# Patient Record
Sex: Female | Born: 1975 | Race: Black or African American | Hispanic: No | Marital: Married | Smoking: Former smoker
Health system: Southern US, Community
[De-identification: ages and names within clinical notes are randomized; demographics above are authoritative.]

## PROBLEM LIST (undated history)

## (undated) DIAGNOSIS — E119 Type 2 diabetes mellitus without complications: Secondary | ICD-10-CM

## (undated) DIAGNOSIS — J45909 Unspecified asthma, uncomplicated: Secondary | ICD-10-CM

## (undated) DIAGNOSIS — I10 Essential (primary) hypertension: Secondary | ICD-10-CM

## (undated) HISTORY — DX: Type 2 diabetes mellitus without complications: E11.9

---

## 2005-07-11 ENCOUNTER — Emergency Department (HOSPITAL_COMMUNITY): Admission: EM | Admit: 2005-07-11 | Discharge: 2005-07-11 | Payer: Self-pay | Admitting: Emergency Medicine

## 2005-09-29 ENCOUNTER — Emergency Department (HOSPITAL_COMMUNITY): Admission: EM | Admit: 2005-09-29 | Discharge: 2005-09-29 | Payer: Self-pay | Admitting: Emergency Medicine

## 2005-12-13 ENCOUNTER — Emergency Department (HOSPITAL_COMMUNITY): Admission: EM | Admit: 2005-12-13 | Discharge: 2005-12-13 | Payer: Self-pay | Admitting: Emergency Medicine

## 2006-09-26 ENCOUNTER — Emergency Department (HOSPITAL_COMMUNITY): Admission: EM | Admit: 2006-09-26 | Discharge: 2006-09-26 | Payer: Self-pay | Admitting: Emergency Medicine

## 2006-12-03 ENCOUNTER — Emergency Department (HOSPITAL_COMMUNITY): Admission: EM | Admit: 2006-12-03 | Discharge: 2006-12-03 | Payer: Self-pay | Admitting: Emergency Medicine

## 2007-12-03 ENCOUNTER — Emergency Department (HOSPITAL_COMMUNITY): Admission: EM | Admit: 2007-12-03 | Discharge: 2007-12-03 | Payer: Self-pay | Admitting: Emergency Medicine

## 2008-04-08 ENCOUNTER — Emergency Department (HOSPITAL_COMMUNITY): Admission: EM | Admit: 2008-04-08 | Discharge: 2008-04-08 | Payer: Self-pay | Admitting: Emergency Medicine

## 2008-12-12 ENCOUNTER — Emergency Department (HOSPITAL_COMMUNITY): Admission: EM | Admit: 2008-12-12 | Discharge: 2008-12-12 | Payer: Self-pay | Admitting: Emergency Medicine

## 2009-06-29 ENCOUNTER — Emergency Department (HOSPITAL_COMMUNITY): Admission: EM | Admit: 2009-06-29 | Discharge: 2009-06-29 | Payer: Self-pay | Admitting: Emergency Medicine

## 2009-09-28 ENCOUNTER — Emergency Department (HOSPITAL_COMMUNITY): Admission: EM | Admit: 2009-09-28 | Discharge: 2009-09-28 | Payer: Self-pay | Admitting: Emergency Medicine

## 2012-01-02 ENCOUNTER — Encounter (HOSPITAL_COMMUNITY): Payer: Self-pay | Admitting: *Deleted

## 2012-01-02 ENCOUNTER — Emergency Department (HOSPITAL_COMMUNITY): Payer: Self-pay

## 2012-01-02 ENCOUNTER — Emergency Department (HOSPITAL_COMMUNITY)
Admission: EM | Admit: 2012-01-02 | Discharge: 2012-01-02 | Disposition: A | Discharge status: Home / Self Care | Payer: Self-pay | Attending: Emergency Medicine | Admitting: Emergency Medicine

## 2012-01-02 DIAGNOSIS — S92911A Unspecified fracture of right toe(s), initial encounter for closed fracture: Secondary | ICD-10-CM

## 2012-01-02 DIAGNOSIS — Y92009 Unspecified place in unspecified non-institutional (private) residence as the place of occurrence of the external cause: Secondary | ICD-10-CM | POA: Insufficient documentation

## 2012-01-02 DIAGNOSIS — S92919A Unspecified fracture of unspecified toe(s), initial encounter for closed fracture: Secondary | ICD-10-CM | POA: Insufficient documentation

## 2012-01-02 DIAGNOSIS — IMO0002 Reserved for concepts with insufficient information to code with codable children: Secondary | ICD-10-CM | POA: Insufficient documentation

## 2012-01-02 DIAGNOSIS — Z91013 Allergy to seafood: Secondary | ICD-10-CM | POA: Insufficient documentation

## 2012-01-02 DIAGNOSIS — F17200 Nicotine dependence, unspecified, uncomplicated: Secondary | ICD-10-CM | POA: Insufficient documentation

## 2012-01-02 MED ORDER — IBUPROFEN 600 MG PO TABS: 600.0000 mg | ORAL_TABLET | Freq: Four times a day (QID) | ORAL | Status: AC | PRN

## 2012-01-02 MED ORDER — HYDROCODONE-ACETAMINOPHEN 5-325 MG PO TABS: 1.0000 | ORAL_TABLET | ORAL | Status: AC | PRN

## 2012-01-02 MED ORDER — IBUPROFEN 800 MG PO TABS
800.0000 mg | ORAL_TABLET | Freq: Once | ORAL | Status: AC
  Administered 2012-01-02: 800 mg via ORAL
  Filled 2012-01-02: qty 1

## 2012-01-02 NOTE — Discharge Instructions (Signed)
Toe Fracture  Your caregiver has diagnosed you as having a fractured toe. A toe fracture is a break in the bone of a toe. "Buddy taping" is a way of splinting your broken toe, by taping the broken toe to the toe next to it. This "buddy taping" will keep the injured toe from moving beyond normal range of motion. Buddy taping also helps the toe heal in a more normal alignment. It may take 6 to 8 weeks for the toe injury to heal.  HOME CARE INSTRUCTIONS   · Leave your toes taped together for as long as directed by your caregiver or until you see a doctor for a follow-up examination. You can change the tape after bathing. Always use a small piece of gauze or cotton between the toes when taping them together. This will help the skin stay dry and prevent infection.  · Apply ice to the injury for 15 to 20 minutes each hour while awake for the first 2 days. Put the ice in a plastic bag and place a towel between the bag of ice and your skin.  · After the first 2 days, apply heat to the injured area. Use heat for the next 2 to 3 days. Place a heating pad on the foot or soak the foot in warm water as directed by your caregiver.  · Keep your foot elevated as much as possible to lessen swelling.  · Wear sturdy, supportive shoes. The shoes should not pinch the toes or fit tightly against the toes.  · Your caregiver may prescribe a rigid shoe if your foot is very swollen.  · Your may be given crutches if the pain is too great and it hurts too much to walk.  · Only take over-the-counter or prescription medicines for pain, discomfort, or fever as directed by your caregiver.  · If your caregiver has given you a follow-up appointment, it is very important to keep that appointment. Not keeping the appointment could result in a chronic or permanent injury, pain, and disability. If there is any problem keeping the appointment, you must call back to this facility for assistance.  SEEK MEDICAL CARE IF:   · You have increased pain or  swelling, not relieved with medications.  · The pain does not get better after 1 week.  · Your injured toe is cold when the others are warm.  SEEK IMMEDIATE MEDICAL CARE IF:   · The toe becomes cold, numb, or white.  · The toe becomes hot (inflamed) and red.  Document Released: 06/29/2000 Document Revised: 06/21/2011 Document Reviewed: 02/16/2008  [employer]® Patient Information ©2012 [employer].

## 2012-01-02 NOTE — ED Notes (Signed)
Struck rt foot against sofa , Pain 4-5th toes.

## 2012-01-05 NOTE — ED Provider Notes (Signed)
History     CSN: [id number]  Arrival date & time 01/02/12  1130   First MD Initiated Contact with Patient 01/02/12 1209      Chief Complaint  Patient presents with  . Toe Injury    (Consider location/radiation/quality/duration/timing/severity/associated sxs/prior treatment) HPI Comments: RUBI TOOLEY presents with continued pain in her right 4th and fifth toes after accidentally kicking the base of a sofa at home today.  Pain is constant and radiates into midfoot with attempts to flex her toes.  She has increased pain with weight bearing.  Rest improves the pain,  And she has tried no medications for relief of her symptoms.  The history is provided by the patient.    History reviewed. No pertinent past medical history.  History reviewed. No pertinent past surgical history.  History reviewed. No pertinent family history.  History  Substance Use Topics  . Smoking status: Current Everyday Smoker  . Smokeless tobacco: Not on file  . Alcohol Use: No    OB History    Grav Para Term Preterm Abortions TAB SAB Ect Mult Living                  Review of Systems  Musculoskeletal: Positive for arthralgias. Negative for joint swelling.  Skin: Negative for wound.  Neurological: Negative for weakness and numbness.    Allergies  Catfish  Home Medications   Current Outpatient Rx  Name Route Sig Dispense Refill  . VITAMIN E 1000 UNITS PO CAPS Oral Take 1,000 Units by mouth daily.    Marland Kitchen HYDROCODONE-ACETAMINOPHEN 5-325 MG PO TABS Oral Take 1 tablet by mouth every 4 (four) hours as needed for pain. 15 tablet 0  . IBUPROFEN 600 MG PO TABS Oral Take 1 tablet (600 mg total) by mouth every 6 (six) hours as needed for pain. 20 tablet 0    BP 127/82  Pulse 92  Temp 98 F (36.7 C) (Oral)  Resp 20  Ht 5\' 6"  (1.676 m)  Wt 250 lb (113.399 kg)  BMI 40.35 kg/m2  SpO2 97%  LMP 12/24/2011  Physical Exam  Constitutional: She appears well-developed and well-nourished.  HENT:    Head: Atraumatic.  Neck: Normal range of motion.  Cardiovascular:       Pulses equal bilaterally  Musculoskeletal: She exhibits tenderness.       Right foot: She exhibits bony tenderness and swelling. She exhibits normal capillary refill and no laceration.       Feet:  Neurological: She is alert. She has normal strength. She displays normal reflexes. No sensory deficit.       Equal strength  Skin: Skin is warm and dry.  Psychiatric: She has a normal mood and affect.    ED Course  Procedures (including critical care time)  Labs Reviewed - No data to display No results found.   1. Toe fracture, right     Post op shoe,  Buddy tape provided by RN  MDM  Patients xrays reviewed with pt.  Prescribed ibuprofen,  Hydrocodone.  Encouraged ice and elevation.  Recheck with Dr. Romeo Apple within 1 week for recheck of injury.        Burgess Amor, [place] 01/05/12 1317

## 2012-01-06 NOTE — ED Provider Notes (Signed)
Medical screening examination/treatment/procedure(s) were performed by non-physician practitioner and as supervising physician I was immediately available for consultation/collaboration.   Taesha Goodell W Trenity Pha, MD 01/06/12 2051 

## 2012-01-08 ENCOUNTER — Telehealth: Payer: Self-pay | Admitting: Orthopedic Surgery

## 2012-01-08 NOTE — Telephone Encounter (Signed)
Rachel Hopkins called to request a follow-up appointment with Dr. Romeo Apple for a fractured toe.  She does not have insurance, so I told her our selfpay policy.  Spoke with our manager, Doneen Poisson and was told that we could schedule her if she would bring at least $50.  I called [name] back to relay this message and she said she had already spoke with the ER department and was told she could come back there for a recheck  And could get a return to work note there, so she did not need the appointment here.  She was ok with this, and thanked me for calling her back.

## 2012-01-09 NOTE — ED Notes (Signed)
Pt arrived to registration desk, she told the clerk that she needed a note to return to work or she would lose her job, rn out to speak w. Pt and explained that she needed to follow up with dr.harrison as ordered from previous visit.  Pt became irate, cursing at staff, "you bitch don't care and is ignorant"   Again attempted to explain to pt, she began yelling and wanted to speak to my supervisor, dr.wickline notified of pt's request and behavior.  Note given to pt for work, cont. To curse at staff and overheard cursing while leaving.

## 2012-07-20 ENCOUNTER — Encounter (HOSPITAL_COMMUNITY): Payer: Self-pay | Admitting: *Deleted

## 2012-07-20 ENCOUNTER — Emergency Department (HOSPITAL_COMMUNITY)
Admission: EM | Admit: 2012-07-20 | Discharge: 2012-07-20 | Disposition: A | Discharge status: Home / Self Care | Payer: Self-pay | Attending: Emergency Medicine | Admitting: Emergency Medicine

## 2012-07-20 DIAGNOSIS — F17200 Nicotine dependence, unspecified, uncomplicated: Secondary | ICD-10-CM | POA: Insufficient documentation

## 2012-07-20 DIAGNOSIS — L738 Other specified follicular disorders: Secondary | ICD-10-CM | POA: Insufficient documentation

## 2012-07-20 DIAGNOSIS — L739 Follicular disorder, unspecified: Secondary | ICD-10-CM

## 2012-07-20 DIAGNOSIS — R51 Headache: Secondary | ICD-10-CM

## 2012-07-20 DIAGNOSIS — J45909 Unspecified asthma, uncomplicated: Secondary | ICD-10-CM | POA: Insufficient documentation

## 2012-07-20 DIAGNOSIS — R42 Dizziness and giddiness: Secondary | ICD-10-CM | POA: Insufficient documentation

## 2012-07-20 HISTORY — DX: Unspecified asthma, uncomplicated: J45.909

## 2012-07-20 MED ORDER — HYDROCODONE-ACETAMINOPHEN 5-325 MG PO TABS
1.0000 | ORAL_TABLET | Freq: Once | ORAL | Status: AC
  Administered 2012-07-20: 1 via ORAL
  Filled 2012-07-20: qty 1

## 2012-07-20 MED ORDER — IBUPROFEN 800 MG PO TABS
800.0000 mg | ORAL_TABLET | Freq: Once | ORAL | Status: AC
  Administered 2012-07-20: 800 mg via ORAL
  Filled 2012-07-20: qty 1

## 2012-07-20 MED ORDER — ONDANSETRON 4 MG PO TBDP
4.0000 mg | ORAL_TABLET | Freq: Once | ORAL | Status: AC
  Administered 2012-07-20: 4 mg via ORAL
  Filled 2012-07-20: qty 1

## 2012-07-20 NOTE — ED Provider Notes (Signed)
History     CSN: [id number]  Arrival date & time 07/20/12  1478   First MD Initiated Contact with Patient 07/20/12 [phone]      Chief Complaint  Patient presents with  . Headache  . Dizziness    (Consider location/radiation/quality/duration/timing/severity/associated sxs/prior treatment) HPI  Rachel Hopkins is a 37 y.o. female who presents to the Emergency Department complaining of headache that woke her from sleep. Headache on the left side of her head and then spread across her forehead. She noticed there is a "bump" on the left side of her head and it is painful. Denies vision changes, hearing changes, trouble speaking or swallowing, fever, chills, nausea, vomiting, shortness of breath, cough, dizziness, weakness.     Past Medical History  Diagnosis Date  . Asthma     History reviewed. No pertinent past surgical history.  History reviewed. No pertinent family history.  History  Substance Use Topics  . Smoking status: Current Every Day Smoker  . Smokeless tobacco: Not on file  . Alcohol Use: No    OB History    Grav Para Term Preterm Abortions TAB SAB Ect Mult Living                  Review of Systems  Constitutional: Negative for fever.       10 Systems reviewed and are negative for acute change except as noted in the HPI.  HENT: Negative for congestion.   Eyes: Negative for discharge and redness.  Respiratory: Negative for cough and shortness of breath.   Cardiovascular: Negative for chest pain.  Gastrointestinal: Negative for vomiting and abdominal pain.  Musculoskeletal: Negative for back pain.  Skin: Negative for rash.  Neurological: Positive for headaches. Negative for syncope and numbness.  Psychiatric/Behavioral:       No behavior change.    Allergies  Catfish  Home Medications  No current outpatient prescriptions on file.  BP 137/88  Pulse 94  Temp 98.3 F (36.8 C) (Oral)  Resp 18  Ht 5\' 7"  (1.702 m)  Wt 250 lb (113.399 kg)  BMI 39.16  kg/m2  SpO2 98%  LMP 06/29/2012  Physical Exam  Nursing note and vitals reviewed. Constitutional: She is oriented to person, place, and time. She appears well-nourished.       Awake, alert, nontoxic appearance.  HENT:  Head: Normocephalic and atraumatic.  Right Ear: External ear normal.  Left Ear: External ear normal.  Eyes: Conjunctivae normal and EOM are normal. Pupils are equal, round, and reactive to light. Right eye exhibits no discharge. Left eye exhibits no discharge.  Neck: Neck supple.  Cardiovascular: Normal heart sounds.   Pulmonary/Chest: Effort normal and breath sounds normal. She exhibits no tenderness.  Abdominal: Soft. There is no tenderness. There is no rebound.  Musculoskeletal: Normal range of motion. She exhibits no tenderness.       Baseline ROM, no obvious new focal weakness.  Neurological: She is alert and oriented to person, place, and time. She has normal reflexes. No cranial nerve deficit. Coordination normal.       Mental status and motor strength appears baseline for patient and situation.  Skin: No rash noted.       10mm raised tender papule to left temporal area above ear c/w infected hair follicle.   Psychiatric: She has a normal mood and affect.    ED Course  Procedures (including critical care time)    MDM  Patient with a headache that woke them from sleep. No  focal deficits. Given antiinflammatory and analgesic. Pt stable in ED with no significant deterioration in condition.The patient appears reasonably screened and/or stabilized for discharge and I doubt any other medical condition or other [facility] requiring further screening, evaluation, or treatment in the ED at this time prior to discharge.  MDM Reviewed: nursing note and vitals           Nicoletta Dress. Colon Branch, MD 07/20/12 [phone]

## 2012-07-20 NOTE — ED Notes (Signed)
Pt woke up with a headache. Pt states she was dizzy, as well when she woke up. Pt has small bump just over her left ear.

## 2012-07-20 NOTE — ED Notes (Signed)
Discharge instructions given and reviewed with patient.  Patient verbalized understanding to take Tylenol and Motrin for headache.  Patient ambulatory with steady gait. Discharged home in good condition.

## 2012-09-26 ENCOUNTER — Encounter (HOSPITAL_COMMUNITY): Payer: Self-pay | Admitting: *Deleted

## 2012-09-26 ENCOUNTER — Emergency Department (HOSPITAL_COMMUNITY): Payer: Self-pay

## 2012-09-26 ENCOUNTER — Emergency Department (HOSPITAL_COMMUNITY)
Admission: EM | Admit: 2012-09-26 | Discharge: 2012-09-26 | Disposition: A | Discharge status: Home / Self Care | Payer: Self-pay | Attending: Emergency Medicine | Admitting: Emergency Medicine

## 2012-09-26 DIAGNOSIS — R112 Nausea with vomiting, unspecified: Secondary | ICD-10-CM | POA: Insufficient documentation

## 2012-09-26 DIAGNOSIS — J40 Bronchitis, not specified as acute or chronic: Secondary | ICD-10-CM

## 2012-09-26 DIAGNOSIS — J45909 Unspecified asthma, uncomplicated: Secondary | ICD-10-CM | POA: Insufficient documentation

## 2012-09-26 DIAGNOSIS — F17200 Nicotine dependence, unspecified, uncomplicated: Secondary | ICD-10-CM | POA: Insufficient documentation

## 2012-09-26 DIAGNOSIS — R079 Chest pain, unspecified: Secondary | ICD-10-CM | POA: Insufficient documentation

## 2012-09-26 DIAGNOSIS — J029 Acute pharyngitis, unspecified: Secondary | ICD-10-CM | POA: Insufficient documentation

## 2012-09-26 DIAGNOSIS — J3489 Other specified disorders of nose and nasal sinuses: Secondary | ICD-10-CM | POA: Insufficient documentation

## 2012-09-26 MED ORDER — ALBUTEROL SULFATE HFA 108 (90 BASE) MCG/ACT IN AERS
2.0000 | INHALATION_SPRAY | Freq: Four times a day (QID) | RESPIRATORY_TRACT | Status: DC
  Administered 2012-09-26: 2 via RESPIRATORY_TRACT
  Filled 2012-09-26: qty 6.7

## 2012-09-26 MED ORDER — MUCINEX DM 30-600 MG PO TB12: 1.0000 | ORAL_TABLET | Freq: Two times a day (BID) | ORAL | Status: DC

## 2012-09-26 NOTE — ED Notes (Signed)
Pt states productive cough, white in color. Vomited last night. States when cough gets bad, it causes her to vomit. NAD at this time.

## 2012-09-26 NOTE — ED Provider Notes (Signed)
History     CSN: [id number]  Arrival date & time 09/26/12  1001   First MD Initiated Contact with Patient 09/26/12 1313      Chief Complaint  Patient presents with  . Cough  . Emesis    (Consider location/radiation/quality/duration/timing/severity/associated sxs/prior treatment) Patient is a 37 y.o. female presenting with cough and vomiting. The history is provided by the patient.  Cough Associated symptoms: chest pain and sore throat   Associated symptoms: no fever, no rash, no shortness of breath and no wheezing   Emesis Associated symptoms: sore throat   Associated symptoms: no abdominal pain and no diarrhea    patient with symptoms consistent with upper respiratory infection for the past 3 days. Significant cough and productive cough for the past 2 days. Post tussive emesis being reported. The patient and trying over-the-counter cold medicines. Patient has chest soreness but no rub chest pain soreness is throughout the entire chest with coughing. Patient's cough is productive white in color. Positive nasal congestion.  Past Medical History  Diagnosis Date  . Asthma     History reviewed. No pertinent past surgical history.  No family history on file.  History  Substance Use Topics  . Smoking status: Current Every Day Smoker    Types: Cigarettes  . Smokeless tobacco: Not on file  . Alcohol Use: No    OB History   Grav Para Term Preterm Abortions TAB SAB Ect Mult Living                  Review of Systems  Constitutional: Negative for fever.  HENT: Positive for congestion, sore throat and sinus pressure.   Respiratory: Positive for cough. Negative for shortness of breath and wheezing.   Cardiovascular: Positive for chest pain.  Gastrointestinal: Positive for nausea and vomiting. Negative for abdominal pain and diarrhea.  Genitourinary: Negative for dysuria.  Musculoskeletal: Negative for back pain.  Skin: Negative for rash.  Hematological: Does not  bruise/bleed easily.    Allergies  Catfish  Home Medications   Current Outpatient Rx  Name  Route  Sig  Dispense  Refill  . dextromethorphan-guaiFENesin (MUCINEX DM) 30-600 MG per 12 hr tablet   Oral   Take 1 tablet by mouth every 12 (twelve) hours.         . Pseudoeph-Doxylamine-DM-APAP (NYQUIL D COLD/FLU PO)   Oral   Take 30 mLs by mouth every 4 (four) hours as needed (cold symptoms).         . Dextromethorphan-Guaifenesin (MUCINEX DM) 30-600 MG TB12   Oral   Take 1 tablet by mouth every 12 (twelve) hours.   28 each   0     BP 127/85  Pulse 78  Temp(Src) 98.2 F (36.8 C) (Oral)  Resp 18  Ht 5\' 6"  (1.676 m)  Wt 400 lb (181.439 kg)  BMI 64.59 kg/m2  SpO2 97%  LMP 09/25/2012  Physical Exam  Nursing note and vitals reviewed. Constitutional: She is oriented to person, place, and time. She appears well-developed and well-nourished. No distress.  HENT:  Head: Normocephalic and atraumatic.  Mouth/Throat: Oropharynx is clear and moist.  Eyes: Conjunctivae and EOM are normal. Pupils are equal, round, and reactive to light.  Neck: Normal range of motion. Neck supple.  Cardiovascular: Normal rate, regular rhythm and normal heart sounds.   No murmur heard. Pulmonary/Chest: Effort normal and breath sounds normal. No respiratory distress. She has no wheezes.  Abdominal: Soft. Bowel sounds are normal. There is no tenderness.  Musculoskeletal: Normal range of motion. She exhibits no edema.  Neurological: She is alert and oriented to person, place, and time. No cranial nerve deficit. Coordination normal.  Skin: Skin is warm and dry. No rash noted.    ED Course  Procedures (including critical care time)  Labs Reviewed - No data to display Dg Chest 2 View  09/26/2012  *RADIOLOGY REPORT*  Clinical Data: Cough and congestion.  Vomiting.  CHEST - 2 VIEW  Comparison: None.  Findings: Lungs are clear.  Heart size is normal.  No pneumothorax or pleural fluid.  IMPRESSION:  Negative chest.   Original Report Authenticated By: Holley Dexter, M.D.      1. Bronchitis       MDM   The patient's chest x-rays negative symptoms consistent with a 3 day history of upper respiratory infection now predominantly of bronchitis. No evidence of pneumonia. Patient nontoxic no acute distress. Will treat with Mucinex DM and albuterol inhaler. Patient will return for any newer worse symptoms.        Shelda Jakes, MD 09/26/12 [phone]

## 2012-09-26 NOTE — ED Notes (Addendum)
States that she started having nasal congestion about 3 days ago, sore throat, cough and now chest soreness and back pain from the coughing.  Unable to visualize posterior pharynx, due to patient fear of gagging.

## 2013-02-26 ENCOUNTER — Ambulatory Visit (HOSPITAL_COMMUNITY)
Admission: RE | Admit: 2013-02-26 | Discharge: 2013-02-26 | Disposition: A | Discharge status: Home / Self Care | Payer: Self-pay | Source: Ambulatory Visit | Attending: Family Medicine | Admitting: Family Medicine

## 2013-02-26 ENCOUNTER — Other Ambulatory Visit (HOSPITAL_COMMUNITY): Payer: Self-pay | Admitting: *Deleted

## 2013-02-26 DIAGNOSIS — M549 Dorsalgia, unspecified: Secondary | ICD-10-CM

## 2013-02-26 DIAGNOSIS — M47817 Spondylosis without myelopathy or radiculopathy, lumbosacral region: Secondary | ICD-10-CM | POA: Insufficient documentation

## 2014-10-16 ENCOUNTER — Encounter (HOSPITAL_COMMUNITY): Payer: Self-pay | Admitting: Emergency Medicine

## 2014-10-16 ENCOUNTER — Emergency Department (HOSPITAL_COMMUNITY): Payer: Self-pay

## 2014-10-16 ENCOUNTER — Emergency Department (HOSPITAL_COMMUNITY)
Admission: EM | Admit: 2014-10-16 | Discharge: 2014-10-17 | Disposition: A | Discharge status: Home / Self Care | Payer: Self-pay | Attending: Emergency Medicine | Admitting: Emergency Medicine

## 2014-10-16 DIAGNOSIS — Z720 Tobacco use: Secondary | ICD-10-CM | POA: Insufficient documentation

## 2014-10-16 DIAGNOSIS — R0789 Other chest pain: Secondary | ICD-10-CM | POA: Insufficient documentation

## 2014-10-16 DIAGNOSIS — Z79899 Other long term (current) drug therapy: Secondary | ICD-10-CM | POA: Insufficient documentation

## 2014-10-16 DIAGNOSIS — B349 Viral infection, unspecified: Secondary | ICD-10-CM | POA: Insufficient documentation

## 2014-10-16 DIAGNOSIS — Z3202 Encounter for pregnancy test, result negative: Secondary | ICD-10-CM | POA: Insufficient documentation

## 2014-10-16 DIAGNOSIS — J45909 Unspecified asthma, uncomplicated: Secondary | ICD-10-CM | POA: Insufficient documentation

## 2014-10-16 LAB — URINALYSIS, ROUTINE W REFLEX MICROSCOPIC
Bilirubin Urine: NEGATIVE
Glucose, UA: NEGATIVE mg/dL
Hgb urine dipstick: NEGATIVE
LEUKOCYTES UA: NEGATIVE
Nitrite: NEGATIVE
PH: 6.5 (ref 5.0–8.0)
PROTEIN: NEGATIVE mg/dL
SPECIFIC GRAVITY, URINE: 1.02 (ref 1.005–1.030)
Urobilinogen, UA: 1 mg/dL (ref 0.0–1.0)

## 2014-10-16 LAB — CBC WITH DIFFERENTIAL/PLATELET
BASOS ABS: 0 10*3/uL (ref 0.0–0.1)
Basophils Relative: 0 % (ref 0–1)
EOS PCT: 5 % (ref 0–5)
Eosinophils Absolute: 0.6 10*3/uL (ref 0.0–0.7)
HEMATOCRIT: 43.4 % (ref 36.0–46.0)
Hemoglobin: 14.4 g/dL (ref 12.0–15.0)
LYMPHS ABS: 2.9 10*3/uL (ref 0.7–4.0)
Lymphocytes Relative: 25 % (ref 12–46)
MCH: 30 pg (ref 26.0–34.0)
MCHC: 33.2 g/dL (ref 30.0–36.0)
MCV: 90.4 fL (ref 78.0–100.0)
MONO ABS: 1 10*3/uL (ref 0.1–1.0)
Monocytes Relative: 9 % (ref 3–12)
NEUTROS PCT: 61 % (ref 43–77)
Neutro Abs: 7 10*3/uL (ref 1.7–7.7)
PLATELETS: 204 10*3/uL (ref 150–400)
RBC: 4.8 MIL/uL (ref 3.87–5.11)
RDW: 13.4 % (ref 11.5–15.5)
WBC: 11.6 10*3/uL — ABNORMAL HIGH (ref 4.0–10.5)

## 2014-10-16 LAB — PREGNANCY, URINE: PREG TEST UR: NEGATIVE

## 2014-10-16 MED ORDER — ONDANSETRON 4 MG PO TBDP
4.0000 mg | ORAL_TABLET | Freq: Once | ORAL | Status: AC
  Administered 2014-10-16: 4 mg via ORAL
  Filled 2014-10-16: qty 1

## 2014-10-16 MED ORDER — ONDANSETRON HCL 4 MG PO TABS
8.0000 mg | ORAL_TABLET | Freq: Once | ORAL | Status: DC
Start: 2014-10-16 — End: 2014-10-16

## 2014-10-16 MED ORDER — ONDANSETRON 4 MG PO TBDP
4.0000 mg | ORAL_TABLET | Freq: Once | ORAL | Status: DC
  Filled 2014-10-16: qty 1

## 2014-10-16 NOTE — ED Provider Notes (Signed)
CSN: [id number]     Arrival date & time 10/16/14  2032 History   None    Chief Complaint  Patient presents with  . Dizziness  . Emesis     (Consider location/radiation/quality/duration/timing/severity/associated sxs/prior Treatment) HPI Comments: Patient is a 39 year old female presents to the emergency department with dizziness and vomiting. The patient states that for the last 2 or 3 days she has been having episodes of epigastric area pain, vomiting, and dizziness. The patient also complains of some pain of the anterior chest. She states that she has a sensation of being lightheaded. At times she feels as though she is short of breath. She has not measured any temperature elevation. She has not been noticing any blood in the sputum or phlegm. There is no been no injury or trauma to the chest or abdomen area.  Patient is a 39 y.o. female presenting with dizziness and vomiting. The history is provided by the patient.  Dizziness Duration:  2 days Timing:  Intermittent Associated symptoms: vomiting   Emesis Associated symptoms: abdominal pain     Past Medical History  Diagnosis Date  . Asthma    History reviewed. No pertinent past surgical history. History reviewed. No pertinent family history. History  Substance Use Topics  . Smoking status: Current Every Day Smoker    Types: Cigarettes  . Smokeless tobacco: Not on file  . Alcohol Use: No   OB History    No data available     Review of Systems  Gastrointestinal: Positive for vomiting and abdominal pain.  Neurological: Positive for dizziness.  All other systems reviewed and are negative.     Allergies  Catfish  Home Medications   Prior to Admission medications   Medication Sig Start Date End Date Taking? Authorizing Provider  ranitidine (ZANTAC) 150 MG tablet Take 150 mg by mouth daily as needed for heartburn.   Yes Historical Provider, MD  Dextromethorphan-Guaifenesin (MUCINEX DM) 30-600 MG TB12 Take 1 tablet by  mouth every 12 (twelve) hours. Patient not taking: Reported on 10/16/2014 09/26/12   Vanetta MuldersScott Zackowski, MD   BP 118/82 mmHg  Pulse 90  Temp(Src) 98.2 F (36.8 C) (Oral)  Resp 18  Ht 5\' 6"  (1.676 m)  Wt 378 lb (171.46 kg)  BMI 61.04 kg/m2  SpO2 97%  LMP 06/07/2014 Physical Exam  Constitutional: She is oriented to person, place, and time. She appears well-developed and well-nourished.  Non-toxic appearance.  HENT:  Head: Normocephalic.  Right Ear: Tympanic membrane and external ear normal.  Left Ear: Tympanic membrane and external ear normal.  Eyes: EOM and lids are normal. Pupils are equal, round, and reactive to light.  Neck: Normal range of motion. Neck supple. Carotid bruit is not present.  Cardiovascular: Normal rate, regular rhythm, normal heart sounds, intact distal pulses and normal pulses.   Pulmonary/Chest: Breath sounds normal. No respiratory distress. She has no wheezes. She has no rales. She exhibits tenderness.    Abdominal: Soft. Bowel sounds are normal. There is tenderness in the epigastric area. There is no guarding.  Musculoskeletal: Normal range of motion.  No lower extremity edema. Neg Homan's sign.  Lymphadenopathy:       Head (right side): No submandibular adenopathy present.       Head (left side): No submandibular adenopathy present.    She has no cervical adenopathy.  Neurological: She is alert and oriented to person, place, and time. She has normal strength. No cranial nerve deficit or sensory deficit. She exhibits normal muscle  tone. Coordination normal.  Skin: Skin is warm and dry.  Psychiatric: She has a normal mood and affect. Her speech is normal.  Nursing note and vitals reviewed.   ED Course  Procedures (including critical care time) Labs Review Labs Reviewed  URINALYSIS, ROUTINE W REFLEX MICROSCOPIC - Abnormal; Notable for the following:    Ketones, ur TRACE (*)    All other components within normal limits  PREGNANCY, URINE    Imaging  Review No results found.   EKG Interpretation None      MDM  Vital signs are well within normal limits. Pulse oximetry is 97% on room air. Within normal limits by my interpretation.  Complete blood count shows a slight elevation of the white blood cells of 11,600. The complete blood count is otherwise within normal limits. A comprehensive metabolic panel shows the ALT to be elevated at 40, otherwise normal. The d-dimer is within normal limits at 0.35. Urine analysis shows a trace of ketones otherwise within normal limits.   Chest x-ray shows no active or acute cardiopulmonary process present. 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  The plan at this time is for the patient to increase fluids. Use Tylenol or ibuprofen for soreness. Prescription for Zofran, Voltaren, and Antivert given to the patient.    Final diagnoses:  None    *I have reviewed nursing notes, vital signs, and all appropriate lab and imaging results for this patient.[address], PA-C 10/17/14 4098  Zadie Rhine, MD 10/17/14 [phone]

## 2014-10-16 NOTE — ED Notes (Signed)
Patient reports dizziness, emesis, epigastric pain since Thursday. Patient also reports feels lightheaded and short of breath.

## 2014-10-17 LAB — COMPREHENSIVE METABOLIC PANEL
ALK PHOS: 66 U/L (ref 39–117)
ALT: 40 U/L — AB (ref 0–35)
AST: 29 U/L (ref 0–37)
Albumin: 3.7 g/dL (ref 3.5–5.2)
Anion gap: 8 (ref 5–15)
BUN: 13 mg/dL (ref 6–23)
CALCIUM: 8.9 mg/dL (ref 8.4–10.5)
CHLORIDE: 105 mmol/L (ref 96–112)
CO2: 26 mmol/L (ref 19–32)
Creatinine, Ser: 0.84 mg/dL (ref 0.50–1.10)
GFR calc Af Amer: >90 mL/min (ref >90)
GFR, EST NON AFRICAN AMERICAN: 88 mL/min — AB (ref >90)
Glucose, Bld: 102 mg/dL — ABNORMAL HIGH (ref 70–99)
Potassium: 4 mmol/L (ref 3.5–5.1)
SODIUM: 139 mmol/L (ref 135–145)
TOTAL PROTEIN: 6.7 g/dL (ref 6.0–8.3)
Total Bilirubin: 0.4 mg/dL (ref 0.3–1.2)

## 2014-10-17 LAB — D-DIMER, QUANTITATIVE (NOT AT ARMC): D DIMER QUANT: 0.35 ug{FEU}/mL (ref 0.00–0.48)

## 2014-10-17 MED ORDER — MECLIZINE HCL 50 MG PO TABS: 50.0000 mg | ORAL_TABLET | Freq: Three times a day (TID) | ORAL | Status: DC | PRN

## 2014-10-17 MED ORDER — DICLOFENAC SODIUM 75 MG PO TBEC: 75.0000 mg | DELAYED_RELEASE_TABLET | Freq: Two times a day (BID) | ORAL | Status: DC

## 2014-10-17 MED ORDER — ONDANSETRON HCL 4 MG PO TABS: 4.0000 mg | ORAL_TABLET | Freq: Four times a day (QID) | ORAL | Status: DC

## 2014-10-17 NOTE — Discharge Instructions (Signed)
Your vital signs are within normal limits. Your labs are negative for acute event. Your x-rays are negative for acute event. Suspect that you have a viral illness, and chest wall tenderness due to inflammation. Please use diclofenac 2 times daily with food. Please use Zofran for nausea. Use Antivert for dizziness. Chest Wall Pain Chest wall pain is pain felt in or around the chest bones and muscles. It may take up to 6 weeks to get better. It may take longer if you are active. Chest wall pain can happen on its own. Other times, things like germs, injury, coughing, or exercise can cause the pain. HOME CARE   Avoid activities that make you tired or cause pain. Try not to use your chest, belly (abdominal), or side muscles. Do not use heavy weights.  Put ice on the sore area.  Put ice in a plastic bag.  Place a towel between your skin and the bag.  Leave the ice on for 15-20 minutes for the first 2 days.  Only take medicine as told by your doctor. GET HELP RIGHT AWAY IF:   You have more pain or are very uncomfortable.  You have a fever.  Your chest pain gets worse.  You have new problems.  You feel sick to your stomach (nauseous) or throw up (vomit).  You start to sweat or feel lightheaded.  You have a cough with mucus (phlegm).  You cough up blood. MAKE SURE YOU:   Understand these instructions.  Will watch your condition.  Will get help right away if you are not doing well or get worse. Document Released: 12/19/2007 Document Revised: 09/24/2011 Document Reviewed: 02/26/2011 [facility] Patient Information 2015 [place], [place]. This information is not intended to replace advice given to you by your health care provider. Make sure you discuss any questions you have with your health care provider.  Viral Infections A virus is a type of germ. Viruses can cause:  Minor sore throats.  Aches and pains.  Headaches.  Runny nose.  Rashes.  Watery  eyes.  Tiredness.  Coughs.  Loss of appetite.  Feeling sick to your stomach (nausea).  Throwing up (vomiting).  Watery poop (diarrhea). HOME CARE   Only take medicines as told by your doctor.  Drink enough water and fluids to keep your pee (urine) clear or pale yellow. Sports drinks are a good choice.  Get plenty of rest and eat healthy. Soups and broths with crackers or rice are fine. GET HELP RIGHT AWAY IF:   You have a very bad headache.  You have shortness of breath.  You have chest pain or neck pain.  You have an unusual rash.  You cannot stop throwing up.  You have watery poop that does not stop.  You cannot keep fluids down.  You or your child has a temperature by mouth above 102 F (38.9 C), not controlled by medicine.  Your baby is older than 3 months with a rectal temperature of 102 F (38.9 C) or higher.  Your baby is 713 months old or younger with a rectal temperature of 100.4 F (38 C) or higher. MAKE SURE YOU:   Understand these instructions.  Will watch this condition.  Will get help right away if you are not doing well or get worse. Document Released: 06/14/2008 Document Revised: 09/24/2011 Document Reviewed: 11/07/2010 [facility] Patient Information 2015 [place], [place]. This information is not intended to replace advice given to you by your health care provider. Make sure you discuss any  questions you have with your health care provider. ° °

## 2015-07-11 ENCOUNTER — Emergency Department (HOSPITAL_COMMUNITY): Payer: Self-pay

## 2015-07-11 ENCOUNTER — Encounter (HOSPITAL_COMMUNITY): Payer: Self-pay | Admitting: *Deleted

## 2015-07-11 DIAGNOSIS — F17210 Nicotine dependence, cigarettes, uncomplicated: Secondary | ICD-10-CM | POA: Insufficient documentation

## 2015-07-11 DIAGNOSIS — Z791 Long term (current) use of non-steroidal anti-inflammatories (NSAID): Secondary | ICD-10-CM | POA: Insufficient documentation

## 2015-07-11 DIAGNOSIS — J069 Acute upper respiratory infection, unspecified: Secondary | ICD-10-CM | POA: Insufficient documentation

## 2015-07-11 DIAGNOSIS — J45909 Unspecified asthma, uncomplicated: Secondary | ICD-10-CM | POA: Insufficient documentation

## 2015-07-11 DIAGNOSIS — E669 Obesity, unspecified: Secondary | ICD-10-CM | POA: Insufficient documentation

## 2015-07-11 NOTE — ED Notes (Signed)
Pt c/o cough, sore throat, and chest congestion since last night.

## 2015-07-12 ENCOUNTER — Emergency Department (HOSPITAL_COMMUNITY)
Admission: EM | Admit: 2015-07-12 | Discharge: 2015-07-12 | Disposition: A | Discharge status: Home / Self Care | Payer: Self-pay | Attending: Emergency Medicine | Admitting: Emergency Medicine

## 2015-07-12 DIAGNOSIS — J069 Acute upper respiratory infection, unspecified: Secondary | ICD-10-CM

## 2015-07-12 DIAGNOSIS — B9789 Other viral agents as the cause of diseases classified elsewhere: Secondary | ICD-10-CM

## 2015-07-12 MED ORDER — IBUPROFEN 800 MG PO TABS: 800.0000 mg | ORAL_TABLET | Freq: Three times a day (TID) | ORAL | Status: DC | PRN

## 2015-07-12 MED ORDER — BENZONATATE 100 MG PO CAPS: 100.0000 mg | ORAL_CAPSULE | Freq: Three times a day (TID) | ORAL | Status: DC | PRN

## 2015-07-12 NOTE — ED Provider Notes (Signed)
TIME SEEN: 2:10 AM  CHIEF COMPLAINT: Cough, sore throat, nasal congestion  HPI: Pt is a 39 y.o. female with history of asthma and obesity who smokes cigarettes who presents to the emergency department with complaints of one day of nonproductive cough, chest congestion, nasal congestion and sore throat. No known fever. No known sick contacts. No vomiting or diarrhea. Does have some aching throughout her body when she coughs. No wheezing.  ROS: See HPI Constitutional: no fever  Eyes: no drainage  ENT: no runny nose   Cardiovascular:  no chest pain  Resp: no SOB  GI: no vomiting GU: no dysuria Integumentary: no rash  Allergy: no hives  Musculoskeletal: no leg swelling  Neurological: no slurred speech ROS otherwise negative  PAST MEDICAL HISTORY/PAST SURGICAL HISTORY:  Past Medical History  Diagnosis Date  . Asthma     MEDICATIONS:  Prior to Admission medications   Medication Sig Start Date End Date Taking? Authorizing Provider  Dextromethorphan-Guaifenesin (MUCINEX DM) 30-600 MG TB12 Take 1 tablet by mouth every 12 (twelve) hours. Patient not taking: Reported on 10/16/2014 09/26/12   Vanetta Mulders, MD  diclofenac (VOLTAREN) 75 MG EC tablet Take 1 tablet (75 mg total) by mouth 2 (two) times daily. 10/17/14   Ivery Quale, PA-C  meclizine (ANTIVERT) 50 MG tablet Take 1 tablet (50 mg total) by mouth 3 (three) times daily as needed for dizziness. 10/17/14   Ivery Quale, PA-C  ondansetron (ZOFRAN) 4 MG tablet Take 1 tablet (4 mg total) by mouth every 6 (six) hours. 10/17/14   Ivery Quale, PA-C  ranitidine (ZANTAC) 150 MG tablet Take 150 mg by mouth daily as needed for heartburn.    Historical Provider, MD    ALLERGIES:  Allergies  Allergen Reactions  . 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 Smoking status: Current Every Day Smoker    Types: Cigarettes  . Smokeless tobacco: Not on file  . Alcohol Use: Yes     Comment:  occasionally    FAMILY HISTORY: No family history on file.  EXAM: BP 96/81 mmHg  Pulse 104  Temp(Src) 99 F (37.2 C) (Oral)  Resp 17  Ht  (1.676 m)  Wt 375 lb (170.099 kg)  BMI 60.56 kg/m2  SpO2 98%  LMP 06/26/2015 CONSTITUTIONAL: Alert and oriented and responds appropriately to questions. Well-appearing; well-nourished, morbidly obese, in no distress, afebrile and nontoxic HEAD: Normocephalic EYES: Conjunctivae clear, PERRL ENT: normal nose; no rhinorrhea; moist mucous membranes; pharynx without lesions noted, no tonsillar hypertrophy or exudate, no uvular deviation, no trismus or drooling, normal phonation, no stridor NECK: Supple, no meningismus, no LAD  CARD: RRR; S1 and S2 appreciated; no murmurs, no clicks, no rubs, no gallops CHEST:  Chest wall is mildly tender to palpation without crepitus, ecchymosis or deformity, no rashes or lesions RESP: Normal chest excursion without splinting or tachypnea; breath sounds clear and equal bilaterally; no wheezes, no rhonchi, no rales, no hypoxia or respiratory distress, speaking full sentences ABD/GI: Normal bowel sounds; non-distended; soft, non-tender, no rebound, no guarding, no peritoneal signs BACK:  The back appears normal and is non-tender to palpation, there is no CVA tenderness EXT: Normal ROM in all joints; non-tender to palpation; no edema; normal capillary refill; no cyanosis, no calf tenderness or swelling    SKIN: Normal color for age and race; warm NEURO: Moves all extremities equally, sensation to light touch intact diffusely, cranial nerves II through XII intact PSYCH:  The patient's mood and manner are appropriate. Grooming and personal hygiene are appropriate.  MEDICAL DECISION MAKING: Patient here with viral illness. Chest x-ray shows no pneumonia. Her lungs are clear. Hemodynamically stable. Afebrile and nontoxic appearing. Have recommended alternating Tylenol and ibuprofen for fever and pain, increase fluid intake  and rest. Discussed with patient why do not feel an excellent be helpful for a viral illness. Will discharge with prescriptions for prescription strength ibuprofen and Tessalon Perles. Discussed return precautions. I do not feel she needs any breathing treatments or steroids as her lungs are clear and she has good aeration and no hypoxia. She verbalizes understanding and is comfortable with this plan.       Layla MawKristen N Jeremy Mclamb, DO 07/12/15 [phone]

## 2015-07-12 NOTE — Discharge Instructions (Signed)
Upper Respiratory Infection, Adult Most upper respiratory infections (URIs) are a viral infection of the air passages leading to the lungs. A URI affects the nose, throat, and upper air passages. The most common type of URI is nasopharyngitis and is typically referred to as "the common cold." URIs run their course and usually go away on their own. Most of the time, a URI does not require medical attention, but sometimes a bacterial infection in the upper airways can follow a viral infection. This is called a secondary infection. Sinus and middle ear infections are common types of secondary upper respiratory infections. Bacterial pneumonia can also complicate a URI. A URI can worsen asthma and chronic obstructive pulmonary disease (COPD). Sometimes, these complications can require emergency medical care and may be life threatening.  CAUSES Almost all URIs are caused by viruses. A virus is a type of germ and can spread from one person to another.  RISKS FACTORS You may be at risk for a URI if:   You smoke.   You have chronic heart or lung disease.  You have a weakened defense (immune) system.   You are very young or very old.   You have nasal allergies or asthma.  You work in crowded or poorly ventilated areas.  You work in health care facilities or schools. SIGNS AND SYMPTOMS  Symptoms typically develop 2-3 days after you come in contact with a cold virus. Most viral URIs last 7-10 days. However, viral URIs from the influenza virus (flu virus) can last 14-18 days and are typically more severe. Symptoms may include:   Runny or stuffy (congested) nose.   Sneezing.   Cough.   Sore throat.   Headache.   Fatigue.   Fever.   Loss of appetite.   Pain in your forehead, behind your eyes, and over your cheekbones (sinus pain).  Muscle aches.  DIAGNOSIS  Your health care provider may diagnose a URI by:  Physical exam.  Tests to check that your symptoms are not due to  another condition such as:  Strep throat.  Sinusitis.  Pneumonia.  Asthma. TREATMENT  A URI goes away on its own with time. It cannot be cured with medicines, but medicines may be prescribed or recommended to relieve symptoms. Medicines may help:  Reduce your fever.  Reduce your cough.  Relieve nasal congestion. HOME CARE INSTRUCTIONS   Take medicines only as directed by your health care provider.   Gargle warm saltwater or take cough drops to comfort your throat as directed by your health care provider.  Use a warm mist humidifier or inhale steam from a shower to increase air moisture. This may make it easier to breathe.  Drink enough fluid to keep your urine clear or pale yellow.   Eat soups and other clear broths and maintain good nutrition.   Rest as needed.   Return to work when your temperature has returned to normal or as your health care provider advises. You may need to stay home longer to avoid infecting others. You can also use a face mask and careful hand washing to prevent spread of the virus.  Increase the usage of your inhaler if you have asthma.   Do not use any tobacco products, including cigarettes, chewing tobacco, or electronic cigarettes. If you need help quitting, ask your health care provider. PREVENTION  The best way to protect yourself from getting a cold is to practice good hygiene.   Avoid oral or hand contact with people with cold   symptoms.   Wash your hands often if contact occurs.  There is no clear evidence that vitamin C, vitamin E, echinacea, or exercise reduces the chance of developing a cold. However, it is always recommended to get plenty of rest, exercise, and practice good nutrition.  SEEK MEDICAL CARE IF:   You are getting worse rather than better.   Your symptoms are not controlled by medicine.   You have chills.  You have worsening shortness of breath.  You have brown or red mucus.  You have yellow or brown nasal  discharge.  You have pain in your face, especially when you bend forward.  You have a fever.  You have swollen neck glands.  You have pain while swallowing.  You have white areas in the back of your throat. SEEK IMMEDIATE MEDICAL CARE IF:   You have severe or persistent:  Headache.  Ear pain.  Sinus pain.  Chest pain.  You have chronic lung disease and any of the following:  Wheezing.  Prolonged cough.  Coughing up blood.  A change in your usual mucus.  You have a stiff neck.  You have changes in your:  Vision.  Hearing.  Thinking.  Mood. MAKE SURE YOU:   Understand these instructions.  Will watch your condition.  Will get help right away if you are not doing well or get worse.   This information is not intended to replace advice given to you by your health care provider. Make sure you discuss any questions you have with your health care provider.   Document Released: 12/26/2000 Document Revised: 11/16/2014 Document Reviewed: 10/07/2013 Elsevier Interactive Patient Education 2016 Elsevier Inc.  

## 2015-12-25 ENCOUNTER — Emergency Department (HOSPITAL_COMMUNITY)
Admission: EM | Admit: 2015-12-25 | Discharge: 2015-12-25 | Disposition: A | Discharge status: Home / Self Care | Payer: Self-pay | Attending: Emergency Medicine | Admitting: Emergency Medicine

## 2015-12-25 ENCOUNTER — Encounter (HOSPITAL_COMMUNITY): Payer: Self-pay | Admitting: Emergency Medicine

## 2015-12-25 DIAGNOSIS — Z791 Long term (current) use of non-steroidal anti-inflammatories (NSAID): Secondary | ICD-10-CM | POA: Insufficient documentation

## 2015-12-25 DIAGNOSIS — F17210 Nicotine dependence, cigarettes, uncomplicated: Secondary | ICD-10-CM | POA: Insufficient documentation

## 2015-12-25 DIAGNOSIS — J45909 Unspecified asthma, uncomplicated: Secondary | ICD-10-CM | POA: Insufficient documentation

## 2015-12-25 DIAGNOSIS — K047 Periapical abscess without sinus: Secondary | ICD-10-CM | POA: Insufficient documentation

## 2015-12-25 MED ORDER — HYDROCODONE-ACETAMINOPHEN 5-325 MG PO TABS: 2.0000 | ORAL_TABLET | ORAL | Status: DC | PRN

## 2015-12-25 MED ORDER — AMOXICILLIN 500 MG PO CAPS: 500.0000 mg | ORAL_CAPSULE | Freq: Three times a day (TID) | ORAL | Status: DC

## 2015-12-25 NOTE — ED Notes (Signed)
PT c/o right lower dental pain x1 week.

## 2015-12-25 NOTE — Discharge Instructions (Signed)

## 2015-12-25 NOTE — ED Provider Notes (Signed)
CSN: [id number]     Arrival date & time 12/25/15  1417 History  By signing my name below, I, Rachel Hopkins, attest that this documentation has been prepared under the direction and in the presence of Langston Masker, [place]. Electronically Signed: Ronney Hopkins, ED Scribe. 12/25/2015. 2:57 PM.    Chief Complaint  Patient presents with  . Dental Pain   The history is provided by the patient. No language interpreter was used.    HPI Comments: Rachel Hopkins is a 40 y.o. female who presents to the Emergency Department complaining of gradual-onset, constant, worsening, aching, right lower dental pain that began 1 week ago. No associated symptoms were noted. Patient states she has been using ibuprofen and biotene rinse, with moderate but transient relief. Patient states she does not have a dentist. She denies a history of any chronic medical conditions. She denies fever or chills. She reports her gums are normally discolored.   Past Medical History  Diagnosis Date  . Asthma    History reviewed. No pertinent past surgical history. History reviewed. No pertinent family history. Social History  Substance Use Topics  . Smoking status: Current Every Day Smoker -- 0.50 packs/day    Types: Cigarettes  . Smokeless tobacco: None  . Alcohol Use: Yes     Comment: occasionally   OB History    Gravida Para Term Preterm AB TAB SAB Ectopic Multiple Living   1         1     Review of Systems  Constitutional: Negative for fever and chills.  HENT: Positive for dental problem.    Allergies  Catfish  Home Medications   Prior to Admission medications   Medication Sig Start Date End Date Taking? Authorizing Provider  benzonatate (TESSALON) 100 MG capsule Take 1 capsule (100 mg total) by mouth 3 (three) times daily as needed for cough. 07/12/15   Kristen N Ward, DO  diclofenac (VOLTAREN) 75 MG EC tablet Take 1 tablet (75 mg total) by mouth 2 (two) times daily. 10/17/14   Ivery Quale, PA-C  ibuprofen  (ADVIL,MOTRIN) 800 MG tablet Take 1 tablet (800 mg total) by mouth every 8 (eight) hours as needed for mild pain. 07/12/15   Kristen N Ward, DO  meclizine (ANTIVERT) 50 MG tablet Take 1 tablet (50 mg total) by mouth 3 (three) times daily as needed for dizziness. 10/17/14   Ivery Quale, PA-C  ondansetron (ZOFRAN) 4 MG tablet Take 1 tablet (4 mg total) by mouth every 6 (six) hours. 10/17/14   Ivery Quale, PA-C  ranitidine (ZANTAC) 150 MG tablet Take 150 mg by mouth daily as needed for heartburn.    Historical Provider, MD   BP 136/82 mmHg  Pulse 80  Temp(Src) 98.8 F (37.1 C) (Oral)  Resp 16  Ht  (1.676 m)  Wt 371 lb (168.284 kg)  BMI 59.91 kg/m2  SpO2 98%  LMP 12/01/2015 Physical Exam  Constitutional: She is oriented to person, place, and time. She appears well-developed and well-nourished. No distress.  HENT:  Head: Normocephalic and atraumatic.  Mouth/Throat: Abnormal dentition. No tonsillar abscesses.  Poor dentition. Abscess to lower frontal gumline.   Eyes: Conjunctivae and EOM are normal.  Neck: Neck supple. No tracheal deviation present.  Cardiovascular: Normal rate.   Pulmonary/Chest: Effort normal. No respiratory distress.  Musculoskeletal: Normal range of motion.  Neurological: She is alert and oriented to person, place, and time.  Skin: Skin is warm and dry.  Psychiatric: She has a normal mood  and affect. Her behavior is normal.  Nursing note and vitals reviewed.   ED Course  Procedures (including critical care time)  DIAGNOSTIC STUDIES: Oxygen Saturation is 98% on RA, normal by my interpretation.    COORDINATION OF CARE: 2:42 PM - Suspect dental infection. Discussed treatment plan with pt at bedside which includes Rx antibiotics and follow-up with dentist. Pt verbalized understanding and agreed to plan.   MDM   Final diagnoses:  Dental abscess   Patient with dentalgia.  No abscess requiring immediate incision and drainage.  Exam not concerning for  Ludwig's angina or pharyngeal abscess.  Will treat with antibiotics Pt instructed to follow-up with dentist.  Discussed return precautions. Pt safe for discharge.   Meds ordered this encounter  Medications  . amoxicillin (AMOXIL) 500 MG capsule    Sig: Take 1 capsule (500 mg total) by mouth 3 (three) times daily.    Dispense:  30 capsule    Refill:  0    Order Specific Question:  Supervising Provider    Answer:  MILLER, BRIAN [[id number]]  . HYDROcodone-acetaminophen (NORCO/VICODIN) 5-325 MG tablet    Sig: Take 2 tablets by mouth every 4 (four) hours as needed.    Dispense:  10 tablet    Refill:  0    Order Specific Question:  Supervising Provider    Answer:  Eber HongMILLER, BRIAN [[id number]]   An After Visit Summary was printed and given to the patient.  I personally performed the services in this documentation, which was scribed in my presence.  The recorded information has been reviewed and considered.   Barnet PallKaren SofiaPAC.  Elson AreasLeslie K Sofia, PA-C 12/25/15 [address]1603  Leslie K DavenportSofia, PA-C 12/25/15 1607  Bethann BerkshireJoseph Zammit, MD 12/25/15 [phone]

## 2016-01-28 ENCOUNTER — Emergency Department (HOSPITAL_COMMUNITY)
Admission: EM | Admit: 2016-01-28 | Discharge: 2016-01-28 | Disposition: A | Discharge status: Home / Self Care | Payer: Self-pay | Attending: Emergency Medicine | Admitting: Emergency Medicine

## 2016-01-28 ENCOUNTER — Encounter (HOSPITAL_COMMUNITY): Payer: Self-pay | Admitting: Emergency Medicine

## 2016-01-28 DIAGNOSIS — Z791 Long term (current) use of non-steroidal anti-inflammatories (NSAID): Secondary | ICD-10-CM | POA: Insufficient documentation

## 2016-01-28 DIAGNOSIS — F17210 Nicotine dependence, cigarettes, uncomplicated: Secondary | ICD-10-CM | POA: Insufficient documentation

## 2016-01-28 DIAGNOSIS — J45909 Unspecified asthma, uncomplicated: Secondary | ICD-10-CM | POA: Insufficient documentation

## 2016-01-28 DIAGNOSIS — K0889 Other specified disorders of teeth and supporting structures: Secondary | ICD-10-CM | POA: Insufficient documentation

## 2016-01-28 MED ORDER — IBUPROFEN 800 MG PO TABS: 800.0000 mg | ORAL_TABLET | Freq: Three times a day (TID) | ORAL | Status: DC

## 2016-01-28 MED ORDER — HYDROCODONE-ACETAMINOPHEN 5-325 MG PO TABS: ORAL_TABLET | ORAL | Status: DC

## 2016-01-28 MED ORDER — CLINDAMYCIN HCL 150 MG PO CAPS: 300.0000 mg | ORAL_CAPSULE | Freq: Four times a day (QID) | ORAL | Status: DC

## 2016-01-28 NOTE — ED Notes (Signed)
Patient c/o left upper dental pain. Per patient used ibuprofen, anbesol, and Kanka with no relief. Patient states pain now radaites into face and behind left eye. Per patient some facial swelling. Denies any fevers but states "I feel hot."

## 2016-01-28 NOTE — ED Provider Notes (Signed)
CSN: [id number]     Arrival date & time 01/28/16  1730 History   First MD Initiated Contact with Patient 01/28/16 1757     Chief Complaint  Patient presents with  . Dental Pain     (Consider location/radiation/quality/duration/timing/severity/associated sxs/prior Treatment) HPI   Rachel Hopkins is a 40 y.o. female who presents to the Emergency Department complaining of left upper and lower dental pain for several days.  Pain has been worsening.  She describes a sharp, throbbing pain radiating into her left face and ear.  Pain is worse with cold foods or liquids.  She has tried OTC analgesics without relief.  She  Denies fever, facial swelling, neck pain, difficulty swallowing.   Past Medical History  Diagnosis Date  . Asthma    No past surgical history on file. No family history on file. Social History  Substance Use Topics  . Smoking status: Current Every Day Smoker -- 0.50 packs/day    Types: Cigarettes  . Smokeless tobacco: Never Used  . Alcohol Use: Yes     Comment: occasionally   OB History    Gravida Para Term Preterm AB TAB SAB Ectopic Multiple Living   1         1     Review of Systems  Constitutional: Negative for fever and appetite change.  HENT: Positive for dental problem. Negative for congestion, facial swelling, sore throat and trouble swallowing.   Eyes: Negative for pain and visual disturbance.  Musculoskeletal: Negative for neck pain and neck stiffness.  Neurological: Negative for dizziness, facial asymmetry and headaches.  Hematological: Negative for adenopathy.  All other systems reviewed and are negative.     Allergies  Catfish  Home Medications   Prior to Admission medications   Medication Sig Start Date End Date Taking? Authorizing Provider  ibuprofen (ADVIL,MOTRIN) 200 MG tablet Take 800 mg by mouth every 6 (six) hours as needed.   Yes Historical Provider, MD  amoxicillin (AMOXIL) 500 MG capsule Take 1 capsule (500 mg total) by mouth 3  (three) times daily. Patient not taking: Reported on 01/28/2016 12/25/15   Lonia SkinnerLeslie K Sofia, PA-C   BP 160/83 mmHg  Pulse 78  Temp(Src) 98.5 F (36.9 C) (Oral)  Resp 18  Ht 5\' 6"  (1.676 m)  Wt 168.284 kg  BMI 59.91 kg/m2  SpO2 96%  LMP 12/22/2015 Physical Exam  Constitutional: She is oriented to person, place, and time. She appears well-developed and well-nourished. No distress.  HENT:  Head: Normocephalic and atraumatic.  Right Ear: Tympanic membrane and ear canal normal.  Left Ear: Tympanic membrane and ear canal normal.  Mouth/Throat: Uvula is midline, oropharynx is clear and moist and mucous membranes are normal. No trismus in the jaw. Dental caries present. No dental abscesses or uvula swelling.  Tenderness and dental caries of the left lower second and third molars and left upper second molar.  No facial swelling, obvious dental abscess, trismus, or sublingual abnml.    Neck: Normal range of motion. Neck supple.  Cardiovascular: Normal rate and regular rhythm.   No murmur heard. Pulmonary/Chest: Effort normal and breath sounds normal. No respiratory distress.  Musculoskeletal: Normal range of motion.  Lymphadenopathy:    She has no cervical adenopathy.  Neurological: She is alert and oriented to person, place, and time. She exhibits normal muscle tone. Coordination normal.  Skin: Skin is warm and dry.  Nursing note and vitals reviewed.   ED Course  Procedures (including critical care time) Labs Review Labs  Reviewed - No data to display  Imaging Review No results found. I have personally reviewed and evaluated these images and lab results as part of my medical decision-making.   EKG Interpretation None      MDM   Final diagnoses:  Pain, dental    Patient well appearing. Vital signs stable. Airway is patent.  No concerning sx's for Ludwig's angina.  No dental abscess currently.  Local referral for dentistry was given. Prescription for ibuprofen, clindamycin, # 10  vicodin   Pauline Aus, PA-C 01/30/16 1301  Donnetta Hutching, MD 01/31/16 [phone]

## 2016-01-28 NOTE — ED Notes (Signed)
Patient verbalizes understanding of discharge instructions, prescriptions, home care and follow up care. Patient out of department at this time. 

## 2016-03-04 ENCOUNTER — Encounter (HOSPITAL_COMMUNITY): Payer: Self-pay | Admitting: Emergency Medicine

## 2016-03-04 ENCOUNTER — Emergency Department (HOSPITAL_COMMUNITY)
Admission: EM | Admit: 2016-03-04 | Discharge: 2016-03-04 | Disposition: A | Discharge status: Home / Self Care | Payer: Self-pay | Attending: Emergency Medicine | Admitting: Emergency Medicine

## 2016-03-04 DIAGNOSIS — F17210 Nicotine dependence, cigarettes, uncomplicated: Secondary | ICD-10-CM | POA: Insufficient documentation

## 2016-03-04 DIAGNOSIS — K047 Periapical abscess without sinus: Secondary | ICD-10-CM | POA: Insufficient documentation

## 2016-03-04 DIAGNOSIS — J45909 Unspecified asthma, uncomplicated: Secondary | ICD-10-CM | POA: Insufficient documentation

## 2016-03-04 MED ORDER — KETOROLAC TROMETHAMINE 60 MG/2ML IM SOLN
60.0000 mg | Freq: Once | INTRAMUSCULAR | Status: AC
  Administered 2016-03-04: 60 mg via INTRAMUSCULAR
  Filled 2016-03-04: qty 2

## 2016-03-04 MED ORDER — IBUPROFEN 800 MG PO TABS: 800.0000 mg | ORAL_TABLET | Freq: Three times a day (TID) | ORAL | 0 refills | Status: DC

## 2016-03-04 MED ORDER — AMOXICILLIN 500 MG PO CAPS: 500.0000 mg | ORAL_CAPSULE | Freq: Three times a day (TID) | ORAL | 0 refills | Status: DC

## 2016-03-04 MED ORDER — AMOXICILLIN 250 MG PO CAPS
500.0000 mg | ORAL_CAPSULE | Freq: Once | ORAL | Status: AC
  Administered 2016-03-04: 500 mg via ORAL
  Filled 2016-03-04: qty 2

## 2016-03-04 NOTE — Discharge Instructions (Signed)

## 2016-03-04 NOTE — ED Triage Notes (Signed)
Pt having right upper dental pain since July, pt states it feels like there is a hole in her gums.  Pt has experienced facial swelling and difficulty swallowing.  Airway patent, denies fever/drainage.

## 2016-03-04 NOTE — ED Provider Notes (Signed)
[facility] Provider Note   CSN: [id number] Arrival date & time: 03/04/16  0650     History   Chief Complaint Chief Complaint  Patient presents with  . Dental Pain    HPI Rachel Hopkins is a 40 y.o. female.  HPI  The patient is a 40 year old female, she has a known history of poor dentition and in fact has been seen in both June and July of this year with similar symptoms including right upper and right lower dental pain with associated swelling. She reports that each time that she gets the antibiotics in hopes her symptoms get better, she had to cancel her last dental appointment for an unknown reason, she now reports that the dental pain has returned, it is the right side, it is the upper and lower jaw, it is gradually worsening, it is not associated with fevers. She states that it hurts to chew, the pain radiates behind her right eye intermittently. She denies any difficulty opening her mouth but does have pain with chewing.  Past Medical History:  Diagnosis Date  . Asthma     There are no active problems to display for this patient.   History reviewed. No pertinent surgical history.  OB History    Gravida Para Term Preterm AB Living   1         1   SAB TAB Ectopic Multiple Live Births                   Home Medications    Prior to Admission medications   Medication Sig Start Date End Date Taking? Authorizing Provider  amoxicillin (AMOXIL) 500 MG capsule Take 1 capsule (500 mg total) by mouth 3 (three) times daily. 03/04/16   Eber HongBrian Abrahm Mancia, MD  ibuprofen (ADVIL,MOTRIN) 800 MG tablet Take 1 tablet (800 mg total) by mouth 3 (three) times daily. 03/04/16   Eber HongBrian Tyriek Hofman, MD    Family History History reviewed. No pertinent family history.  Social History Social History  Substance Use Topics  . Smoking status: Current Every Day Smoker    Packs/day: 0.50    Types: Cigarettes  . Smokeless tobacco: Never Used  . Alcohol use Yes     Comment: occasionally      Allergies   Catfish [fish allergy]   Review of Systems Review of Systems  Constitutional: Negative for chills and fever.  HENT: Positive for dental problem and facial swelling. Negative for sore throat, trouble swallowing and voice change.        Toothache  Gastrointestinal: Negative for nausea and vomiting.     Physical Exam Updated Vital Signs BP 129/87 (BP Location: Left Arm)   Pulse 93   Temp 98.3 F (36.8 C) (Oral)   Resp 16   Ht 5\' 7"  (1.702 m)   Wt (!) 360 lb (163.3 kg)   LMP 01/14/2016 Comment: pt reports she is always late   SpO2 95%   BMI 56.38 kg/m   Physical Exam  Constitutional: She appears well-developed and well-nourished. No distress.  HENT:  Head: Normocephalic and atraumatic.  Mouth/Throat: Oropharynx is clear and moist. No oropharyngeal exudate.  Dental Disease, lower second molar missing, mild tenderness and swelling surrounding the gumline, no fluctuant mass, upper molar with severe degradation down to the gumline, surrounding gingival swelling but no abscess or area of fluctuance, no jaw asymmetry, no trismus or torticollis, phonation is normal  Eyes: Conjunctivae are normal. No scleral icterus.  Neck: Normal range of motion. Neck supple.  No thyromegaly present.  Supple neck, no lymphadenopathy palpated around the trauma  Cardiovascular: Normal rate and regular rhythm.   Pulmonary/Chest: Effort normal and breath sounds normal.  Lymphadenopathy:    She has no cervical adenopathy.  Neurological: She is alert.  Skin: Skin is warm and dry. No rash noted. She is not diaphoretic.  Nursing note and vitals reviewed.    ED Treatments / Results  Labs (all labs ordered are listed, but only abnormal results are displayed) Labs Reviewed - No data to display  EKG  EKG Interpretation None       Radiology No results found.  Procedures Procedures (including critical care time)  Medications Ordered in ED Medications  ketorolac (TORADOL)  injection 60 mg (60 mg Intramuscular Given 03/04/16 0722)  amoxicillin (AMOXIL) capsule 500 mg (500 mg Oral Given 03/04/16 16100721)     Initial Impression / Assessment and Plan / ED Course  I have reviewed the triage vital signs and the nursing notes.  Pertinent labs & imaging results that were available during my care of the patient were reviewed by me and considered in my medical decision making (see chart for details).  Clinical Course  Comment By Time  Overall the patient appears well, she is afebrile, there is no tachycardia or hypotension, this infection in her gumline does not appear to extend it into any soft tissue spaces, there is no signs of Ludwig's angina, no involvement of the airway, she will receive antibiotics and encouraged to follow up very closely with both a medical doctor for routine care she does not have one and likely needs one as well as a dentist. She is agreeable to this plan. We'll provide Toradol and provide anti-inflammatories as an outpatient. She has been given both amoxicillin and clindamycin over the last couple of months, she has been given Vicodin and anti-inflammatories as well. I will not prescribe opiate medications today. Eber HongBrian Mc Hollen, MD 08/20 [phone]      Final Clinical Impressions(s) / ED Diagnoses   Final diagnoses:  Dental infection    New Prescriptions New Prescriptions   AMOXICILLIN (AMOXIL) 500 MG CAPSULE    Take 1 capsule (500 mg total) by mouth 3 (three) times daily.   IBUPROFEN (ADVIL,MOTRIN) 800 MG TABLET    Take 1 tablet (800 mg total) by mouth 3 (three) times daily.     Eber HongBrian Chrisie Jankovich, MD 03/04/16 [phone]

## 2016-08-26 ENCOUNTER — Emergency Department (HOSPITAL_COMMUNITY): Payer: Self-pay

## 2016-08-26 ENCOUNTER — Encounter (HOSPITAL_COMMUNITY): Payer: Self-pay | Admitting: Emergency Medicine

## 2016-08-26 ENCOUNTER — Emergency Department (HOSPITAL_COMMUNITY)
Admission: EM | Admit: 2016-08-26 | Discharge: 2016-08-26 | Disposition: A | Discharge status: Home / Self Care | Payer: Self-pay | Attending: Emergency Medicine | Admitting: Emergency Medicine

## 2016-08-26 DIAGNOSIS — J40 Bronchitis, not specified as acute or chronic: Secondary | ICD-10-CM | POA: Insufficient documentation

## 2016-08-26 DIAGNOSIS — R0789 Other chest pain: Secondary | ICD-10-CM

## 2016-08-26 DIAGNOSIS — R112 Nausea with vomiting, unspecified: Secondary | ICD-10-CM | POA: Insufficient documentation

## 2016-08-26 DIAGNOSIS — F17210 Nicotine dependence, cigarettes, uncomplicated: Secondary | ICD-10-CM | POA: Insufficient documentation

## 2016-08-26 DIAGNOSIS — J069 Acute upper respiratory infection, unspecified: Secondary | ICD-10-CM | POA: Insufficient documentation

## 2016-08-26 MED ORDER — PROMETHAZINE HCL 25 MG PO TABS: 25.0000 mg | ORAL_TABLET | Freq: Four times a day (QID) | ORAL | 0 refills | Status: DC | PRN

## 2016-08-26 MED ORDER — ONDANSETRON HCL 4 MG PO TABS
4.0000 mg | ORAL_TABLET | Freq: Once | ORAL | Status: AC
  Administered 2016-08-26: 4 mg via ORAL
  Filled 2016-08-26: qty 1

## 2016-08-26 MED ORDER — IBUPROFEN 800 MG PO TABS
800.0000 mg | ORAL_TABLET | Freq: Once | ORAL | Status: AC
  Administered 2016-08-26: 800 mg via ORAL
  Filled 2016-08-26: qty 1

## 2016-08-26 MED ORDER — HYDROCODONE-HOMATROPINE 5-1.5 MG/5ML PO SYRP
5.0000 mL | ORAL_SOLUTION | Freq: Four times a day (QID) | ORAL | 0 refills | Status: DC | PRN
Start: 2016-08-26 — End: 2018-06-10

## 2016-08-26 MED ORDER — AZITHROMYCIN 250 MG PO TABS: ORAL_TABLET | ORAL | 0 refills | Status: DC

## 2016-08-26 MED ORDER — IBUPROFEN 600 MG PO TABS
600.0000 mg | ORAL_TABLET | Freq: Four times a day (QID) | ORAL | 0 refills | Status: DC | PRN
Start: 2016-08-26 — End: 2020-12-05

## 2016-08-26 MED ORDER — ACETAMINOPHEN 325 MG PO TABS
650.0000 mg | ORAL_TABLET | Freq: Once | ORAL | Status: AC
  Administered 2016-08-26: 650 mg via ORAL
  Filled 2016-08-26: qty 2

## 2016-08-26 NOTE — ED Provider Notes (Signed)
[facility] Provider Note   CSN: [id number] Arrival date & time: 08/26/16  4098  By signing my name below, I, Cynda Acres, attest that this documentation has been prepared under the direction and in the presence of Ivery Quale PA-C Electronically Signed: Cynda Acres, Scribe. 08/26/16. 11:44 AM.   History   Chief Complaint Chief Complaint  Patient presents with  . Cough  . Nasal Congestion  . Nausea  . Emesis  . Diarrhea    HPI Comments: Rachel Hopkins is a 41 y.o. female with a hx of asthma, who presents to the Emergency Department complaining of an gradual-onset, intermitted productive cough that began 3 days ago. Patient has associated chills, nasal congestion, nausea, vomiting, diarrhea, myalgias, and chest wall pain. Patient describes her chest wall pain as stabbing. Patient states she is a heavy smoker, has not smoked since she has been sick. Patient did not receive the flu shot this year. Patient has had flu exposure, from her daughter who was diagnosed with the flu one week ago. Patient also complains of a "bump" to the left lower jaw. Patient denies any fever, hemoptysis, or any other symptoms.   The history is provided by the patient. No language interpreter was used.    Past Medical History:  Diagnosis Date  . Asthma     There are no active problems to display for this patient.   History reviewed. No pertinent surgical history.  OB History    Gravida Para Term Preterm AB Living   1         1   SAB TAB Ectopic Multiple Live Births                   Home Medications    Prior to Admission medications   Medication Sig Start Date End Date Taking? Authorizing Provider  amoxicillin (AMOXIL) 500 MG capsule Take 1 capsule (500 mg total) by mouth 3 (three) times daily. 03/04/16   Eber Hong, MD  ibuprofen (ADVIL,MOTRIN) 800 MG tablet Take 1 tablet (800 mg total) by mouth 3 (three) times daily. 03/04/16   Eber Hong, MD    Family History No  family history on file.  Social History Social History  Substance Use Topics  . Smoking status: Current Every Day Smoker    Packs/day: 0.50    Types: Cigarettes  . Smokeless tobacco: Never Used  . Alcohol use Yes     Comment: occasionally     Allergies   Catfish [fish allergy]   Review of Systems Review of Systems  Constitutional: Negative for fever.  HENT: Positive for congestion.   Gastrointestinal: Positive for diarrhea, nausea and vomiting.  Musculoskeletal: Positive for arthralgias (Chest wall) and myalgias.  All other systems reviewed and are negative.    Physical Exam Updated Vital Signs BP 115/83 (BP Location: Left Arm)   Pulse 96   Temp 98.6 F (37 C) (Oral)   Resp 16   Ht 5\' 6"  (1.676 m)   Wt (!) 348 lb (157.9 kg)   SpO2 97%   BMI 56.17 kg/m   Physical Exam  Constitutional: She is oriented to person, place, and time. She appears well-developed.  HENT:  Head: Normocephalic and atraumatic.  Right Ear: External ear normal.  Left Ear: External ear normal.  Mouth/Throat: Oropharynx is clear and moist.  There is a raised tender area of the mucosa of the gum, just below the right canine. Nasal congestion present.   Eyes: Conjunctivae and EOM are normal. Pupils  are equal, round, and reactive to light.  Neck: Normal range of motion. Neck supple.  No cervical lymph adenopathy.   Cardiovascular: Normal rate, regular rhythm and normal heart sounds.  Exam reveals no gallop and no friction rub.   No murmur heard. Pulmonary/Chest: Effort normal. No respiratory distress. She has wheezes. She has rales.  Symmetrical rise and fall of the chest. Patient speaks in full sentences. Few rhonchi at clear with cough.  Faint end expiratory wheeze.   Abdominal: Soft. Bowel sounds are normal. She exhibits no distension. There is no tenderness.  No splenomegaly and no adenomegaly.   Musculoskeletal: Normal range of motion.  Capillary refill less than two seconds. No hot  joints.  Lymphadenopathy:    She has no cervical adenopathy.  Neurological: She is alert and oriented to person, place, and time.  Skin: Skin is warm and dry.  Psychiatric: She has a normal mood and affect.     ED Treatments / Results  DIAGNOSTIC STUDIES: Oxygen Saturation is 97% on RA, normal by my interpretation.    COORDINATION OF CARE: 11:42 AM Discussed treatment plan with pt at bedside and pt agreed to plan, which includes increased hand washing and fluid intake.   Labs (all labs ordered are listed, but only abnormal results are displayed) Labs Reviewed - No data to display  EKG  EKG Interpretation None       Radiology No results found.  Procedures Procedures (including critical care time)  Medications Ordered in ED Medications - No data to display   Initial Impression / Assessment and Plan / ED Course  I have reviewed the triage vital signs and the nursing notes.  Pertinent labs & imaging results that were available during my care of the patient were reviewed by me and considered in my medical decision making (see chart for details).     *I have reviewed nursing notes, vital signs, and all appropriate lab and imaging results for this patient.**  Final Clinical Impressions(s) / ED Diagnoses   MDM: Patient with a 3 day history of upper respiratory symptoms. Patient now has body aches and congestion. Patient has a history of nausea, vomiting, and diarrhea. Daughter was sick with influenza last week.  The plan at this time will be an antiemetic, cough and congestion medication, and tylenol/ibuprofen for fever/aching. We discussed good handwashing and good hydration. Patient is agreement with this discharge plan.   Final diagnoses:  Bronchitis  Upper respiratory tract infection, unspecified type  Chest wall pain    New Prescriptions Discharge Medication List as of 08/26/2016  1:21 PM    START taking these medications   Details  azithromycin (ZITHROMAX)  250 MG tablet 1 po daily, Print    HYDROcodone-homatropine (HYCODAN) 5-1.5 MG/5ML syrup Take 5 mLs by mouth every 6 (six) hours as needed., Starting Sun 08/26/2016, Print    promethazine (PHENERGAN) 25 MG tablet Take 1 tablet (25 mg total) by mouth every 6 (six) hours as needed for nausea or vomiting., Starting Sun 08/26/2016, Print       **I personally performed the services described in this documentation, which was scribed in my presence. The recorded information has been reviewed and is accurate.Ivery Quale*    Milano Rosevear, PA-C 08/29/16 1732    Benjiman CoreNathan Pickering, MD 08/30/16 [phone]

## 2016-08-26 NOTE — Discharge Instructions (Addendum)
Your electrocardiogram in your chest x-ray are negative for acute problem. I suspect chest wall pain related to your cough. Please use Zithromax, ibuprofen daily with food. Use promethazine for nausea/vomiting. Use Hycodan for cough. Promethazine and Hycodan may cause drowsiness, please do not drink alcohol, drive a vehicle, operate machinery, or participate in activities requiring concentration when taking either of these medications. Please use your mask until symptoms have resolved. Wash her hands frequently, and have all members of her household wash hands frequently.

## 2016-08-26 NOTE — ED Triage Notes (Signed)
Patient complains of cough and congestion with N/V/D x 3 days.

## 2016-08-26 NOTE — ED Notes (Signed)
URI x 1 week- HB in to assess

## 2016-09-06 ENCOUNTER — Emergency Department (HOSPITAL_COMMUNITY)
Admission: EM | Admit: 2016-09-06 | Discharge: 2016-09-06 | Disposition: A | Discharge status: Home / Self Care | Payer: Self-pay | Attending: Emergency Medicine | Admitting: Emergency Medicine

## 2016-09-06 ENCOUNTER — Encounter (HOSPITAL_COMMUNITY): Payer: Self-pay

## 2016-09-06 DIAGNOSIS — J45909 Unspecified asthma, uncomplicated: Secondary | ICD-10-CM | POA: Insufficient documentation

## 2016-09-06 DIAGNOSIS — K029 Dental caries, unspecified: Secondary | ICD-10-CM | POA: Insufficient documentation

## 2016-09-06 DIAGNOSIS — F17210 Nicotine dependence, cigarettes, uncomplicated: Secondary | ICD-10-CM | POA: Insufficient documentation

## 2016-09-06 DIAGNOSIS — K047 Periapical abscess without sinus: Secondary | ICD-10-CM | POA: Insufficient documentation

## 2016-09-06 MED ORDER — HYDROCODONE-ACETAMINOPHEN 5-325 MG PO TABS: 1.0000 | ORAL_TABLET | ORAL | 0 refills | Status: DC | PRN

## 2016-09-06 MED ORDER — AMOXICILLIN 500 MG PO CAPS: 500.0000 mg | ORAL_CAPSULE | Freq: Three times a day (TID) | ORAL | 0 refills | Status: AC

## 2016-09-06 MED ORDER — AMOXICILLIN 250 MG PO CAPS
500.0000 mg | ORAL_CAPSULE | Freq: Once | ORAL | Status: AC
Start: 2016-09-06 — End: 2016-09-06
  Administered 2016-09-06: 500 mg via ORAL
  Filled 2016-09-06: qty 2

## 2016-09-06 NOTE — Discharge Instructions (Signed)
Complete your entire course of antibiotics as prescribed.  You  may use the hydrocodone for pain relief but do not drive within 4 hours of taking as this will make you drowsy.  Avoid applying heat or ice to this abscess area which can worsen your symptoms.  You may use warm salt water swish and spit treatment or half peroxide and water swish and spit after meals to keep this area clean as discussed.  Call the dentist listed above for further management of your symptoms.  

## 2016-09-06 NOTE — ED Triage Notes (Signed)
Reports of facial swelling under left eye that started last night. Patient reports of using ice last night which helped. Also has "bad tooth on that side".

## 2016-09-06 NOTE — ED Provider Notes (Signed)
[facility] Provider Note   CSN: [id number] Arrival date & time: 09/06/16  1344     History   Chief Complaint Chief Complaint  Patient presents with  . Facial Swelling    HPI Rachel Hopkins is a 41 y.o. female presenting with 1 day history of dental pain and left cheek swelling.   The patient has a history of  decay in the tooth involved which has recently started to cause increased  pain.  There has been no fevers, chills, nausea or vomiting, also no complaint of difficulty swallowing, although chewing makes pain worse.  She has used ice on her cheek which did not improve the swelling.  The patient has tried ibuprofen 800 mg without relief of symptoms.    .  The history is provided by the patient.    Past Medical History:  Diagnosis Date  . Asthma     There are no active problems to display for this patient.   History reviewed. No pertinent surgical history.  OB History    Gravida Para Term Preterm AB Living   1         1   SAB TAB Ectopic Multiple Live Births                   Home Medications    Prior to Admission medications   Medication Sig Start Date End Date Taking? Authorizing Provider  amoxicillin (AMOXIL) 500 MG capsule Take 1 capsule (500 mg total) by mouth 3 (three) times daily. 09/06/16 09/16/16  Burgess Amor, PA-C  azithromycin (ZITHROMAX) 250 MG tablet 1 po daily 08/26/16   Ivery Quale, PA-C  HYDROcodone-acetaminophen (NORCO/VICODIN) 5-325 MG tablet Take 1 tablet by mouth every 4 (four) hours as needed. 09/06/16   Burgess Amor, PA-C  HYDROcodone-homatropine (HYCODAN) 5-1.5 MG/5ML syrup Take 5 mLs by mouth every 6 (six) hours as needed. 08/26/16   Ivery Quale, PA-C  ibuprofen (ADVIL,MOTRIN) 600 MG tablet Take 1 tablet (600 mg total) by mouth every 6 (six) hours as needed. 08/26/16   Ivery Quale, PA-C  promethazine (PHENERGAN) 25 MG tablet Take 1 tablet (25 mg total) by mouth every 6 (six) hours as needed for nausea or vomiting. 08/26/16   Ivery Quale, PA-C    Family History No family history on file.  Social History Social History  Substance Use Topics  . Smoking status: Current Every Day Smoker    Packs/day: 0.50    Types: Cigarettes  . Smokeless tobacco: Never Used  . Alcohol use Yes     Comment: occasionally     Allergies   Catfish [fish allergy]   Review of Systems Review of Systems  Constitutional: Negative for fever.  HENT: Positive for dental problem. Negative for facial swelling and sore throat.   Respiratory: Negative for shortness of breath.   Musculoskeletal: Negative for neck pain and neck stiffness.     Physical Exam Updated Vital Signs BP 154/92   Pulse 90   Temp 97.3 F (36.3 C) (Oral)   Resp 17   Ht 5\' 6"  (1.676 m)   Wt (!) 157.9 kg   SpO2 98%   BMI 56.17 kg/m   Physical Exam  Constitutional: She is oriented to person, place, and time. She appears well-developed and well-nourished. No distress.  HENT:  Head: Normocephalic and atraumatic.  Right Ear: Tympanic membrane and external ear normal.  Left Ear: Tympanic membrane and external ear normal.  Nose: Nose normal. No mucosal edema or rhinorrhea.  Mouth/Throat:  Oropharynx is clear and moist and mucous membranes are normal. No oral lesions. No trismus in the jaw. Dental abscesses and dental caries present.    Soft edema left cheek, no induration or erythema.  ttp along lateral upper 2nd molar tooth.  Eyes: Conjunctivae are normal.  Neck: Normal range of motion. Neck supple.  Cardiovascular: Normal rate and normal heart sounds.   Pulmonary/Chest: Effort normal.  Abdominal: She exhibits no distension.  Musculoskeletal: Normal range of motion.  Lymphadenopathy:    She has no cervical adenopathy.  Neurological: She is alert and oriented to person, place, and time.  Skin: Skin is warm and dry. No erythema.  Psychiatric: She has a normal mood and affect.     ED Treatments / Results  Labs (all labs ordered are listed, but only  abnormal results are displayed) Labs Reviewed - No data to display  EKG  EKG Interpretation None       Radiology No results found.  Procedures Procedures (including critical care time)  Medications Ordered in ED Medications  amoxicillin (AMOXIL) capsule 500 mg (500 mg Oral Given 09/06/16 1518)     Initial Impression / Assessment and Plan / ED Course  I have reviewed the triage vital signs and the nursing notes.  Pertinent labs & imaging results that were available during my care of the patient were reviewed by me and considered in my medical decision making (see chart for details).     Amoxil, hydrocodone. F/u dentistry.  Pt has been attempting to establish with dentist.  [facility] call back with appt time.  Pt has no mouth or posterior pharynx edema.  No trismus.  The patient appears reasonably screened and/or stabilized for discharge and I doubt any other medical condition or other [facility] requiring further screening, evaluation, or treatment in the ED at this time prior to discharge.   Final Clinical Impressions(s) / ED Diagnoses   Final diagnoses:  Dental abscess    New Prescriptions New Prescriptions   AMOXICILLIN (AMOXIL) 500 MG CAPSULE    Take 1 capsule (500 mg total) by mouth 3 (three) times daily.   HYDROCODONE-ACETAMINOPHEN (NORCO/VICODIN) 5-325 MG TABLET    Take 1 tablet by mouth every 4 (four) hours as needed.     Burgess AmorJulie Paije Goodhart, PA-C 09/06/16 1521    Burgess AmorJulie Raphael Fitzpatrick, PA-C 09/06/16 [id number]    Eber HongBrian Miller, MD 09/07/16 [phone]

## 2017-06-14 IMAGING — DX DG CHEST 2V
2 series · 2 of 2 positions shown · non-contrast
Comparison: 07/11/2015

CLINICAL DATA: Cough, congestion.

EXAM:
CHEST  2 VIEW

[chest pa]
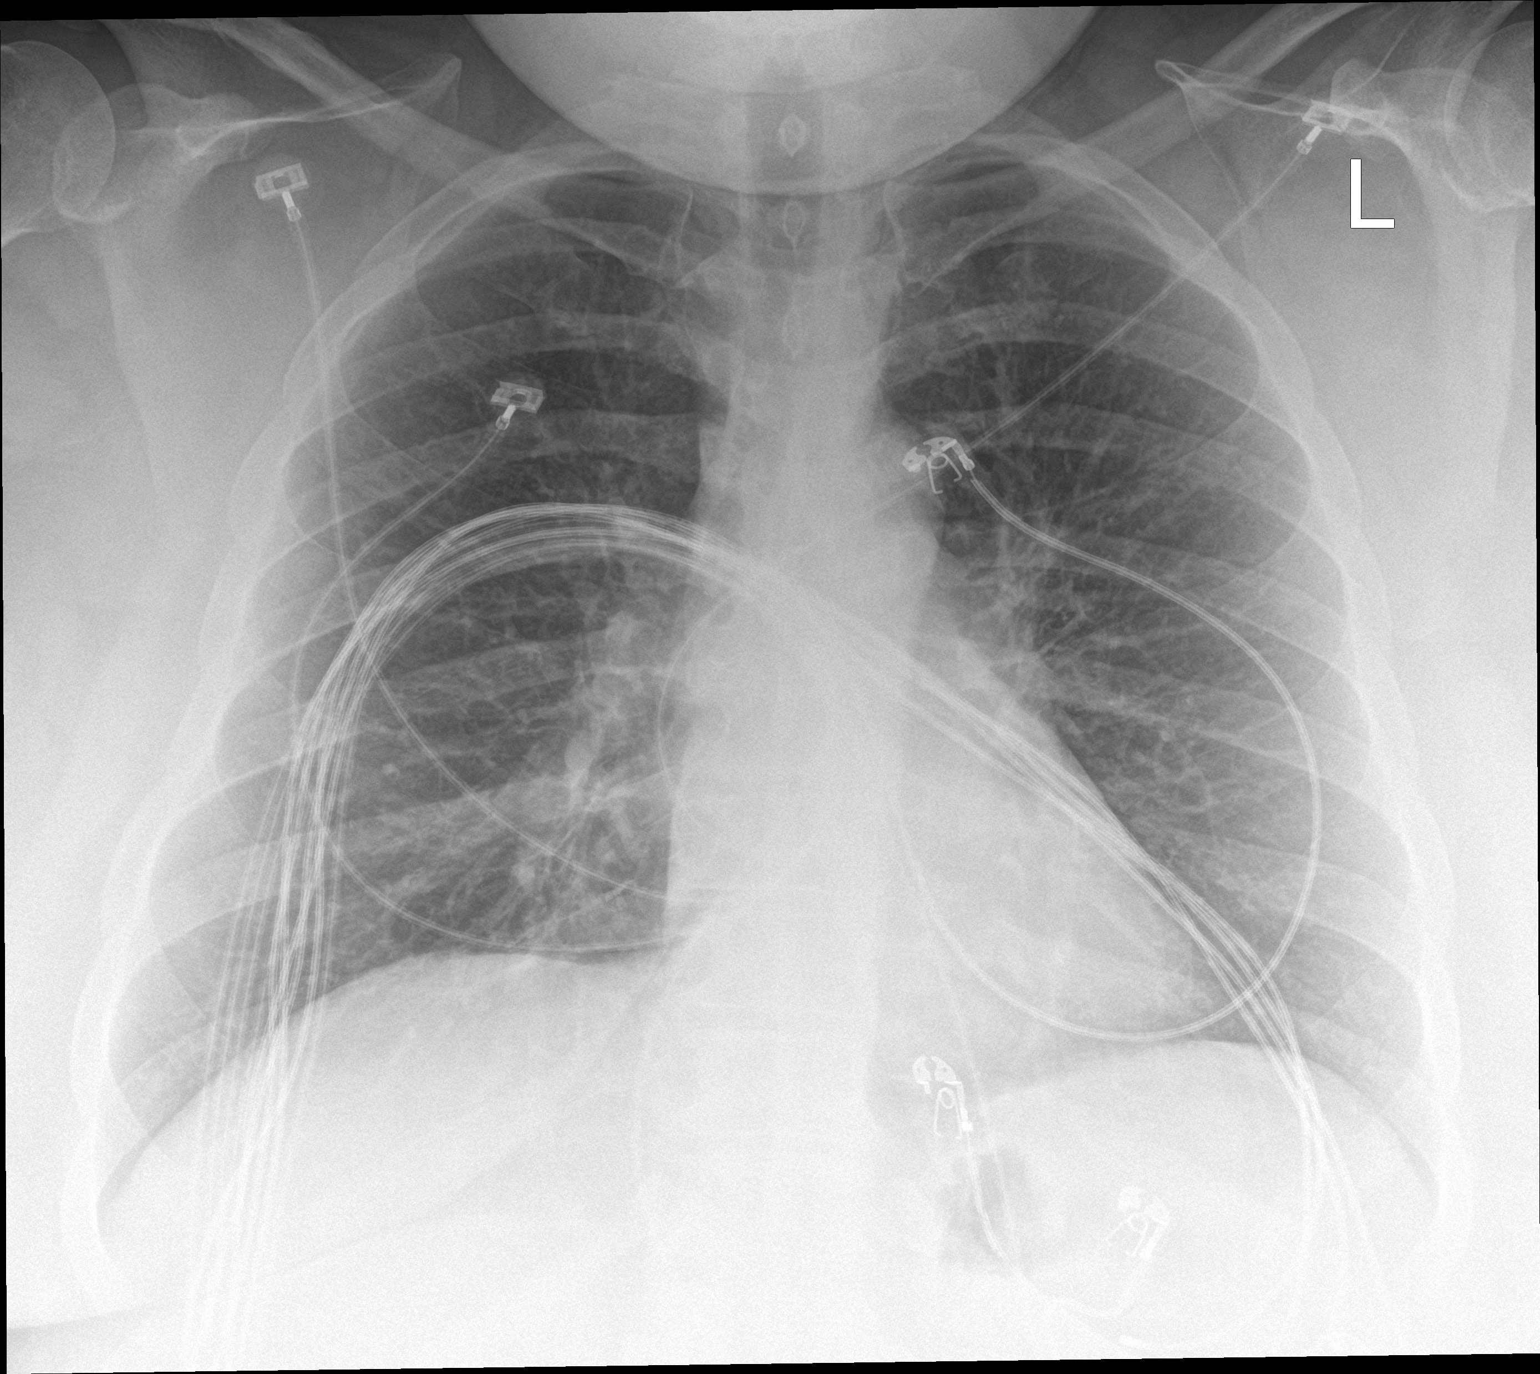

[chest lat]
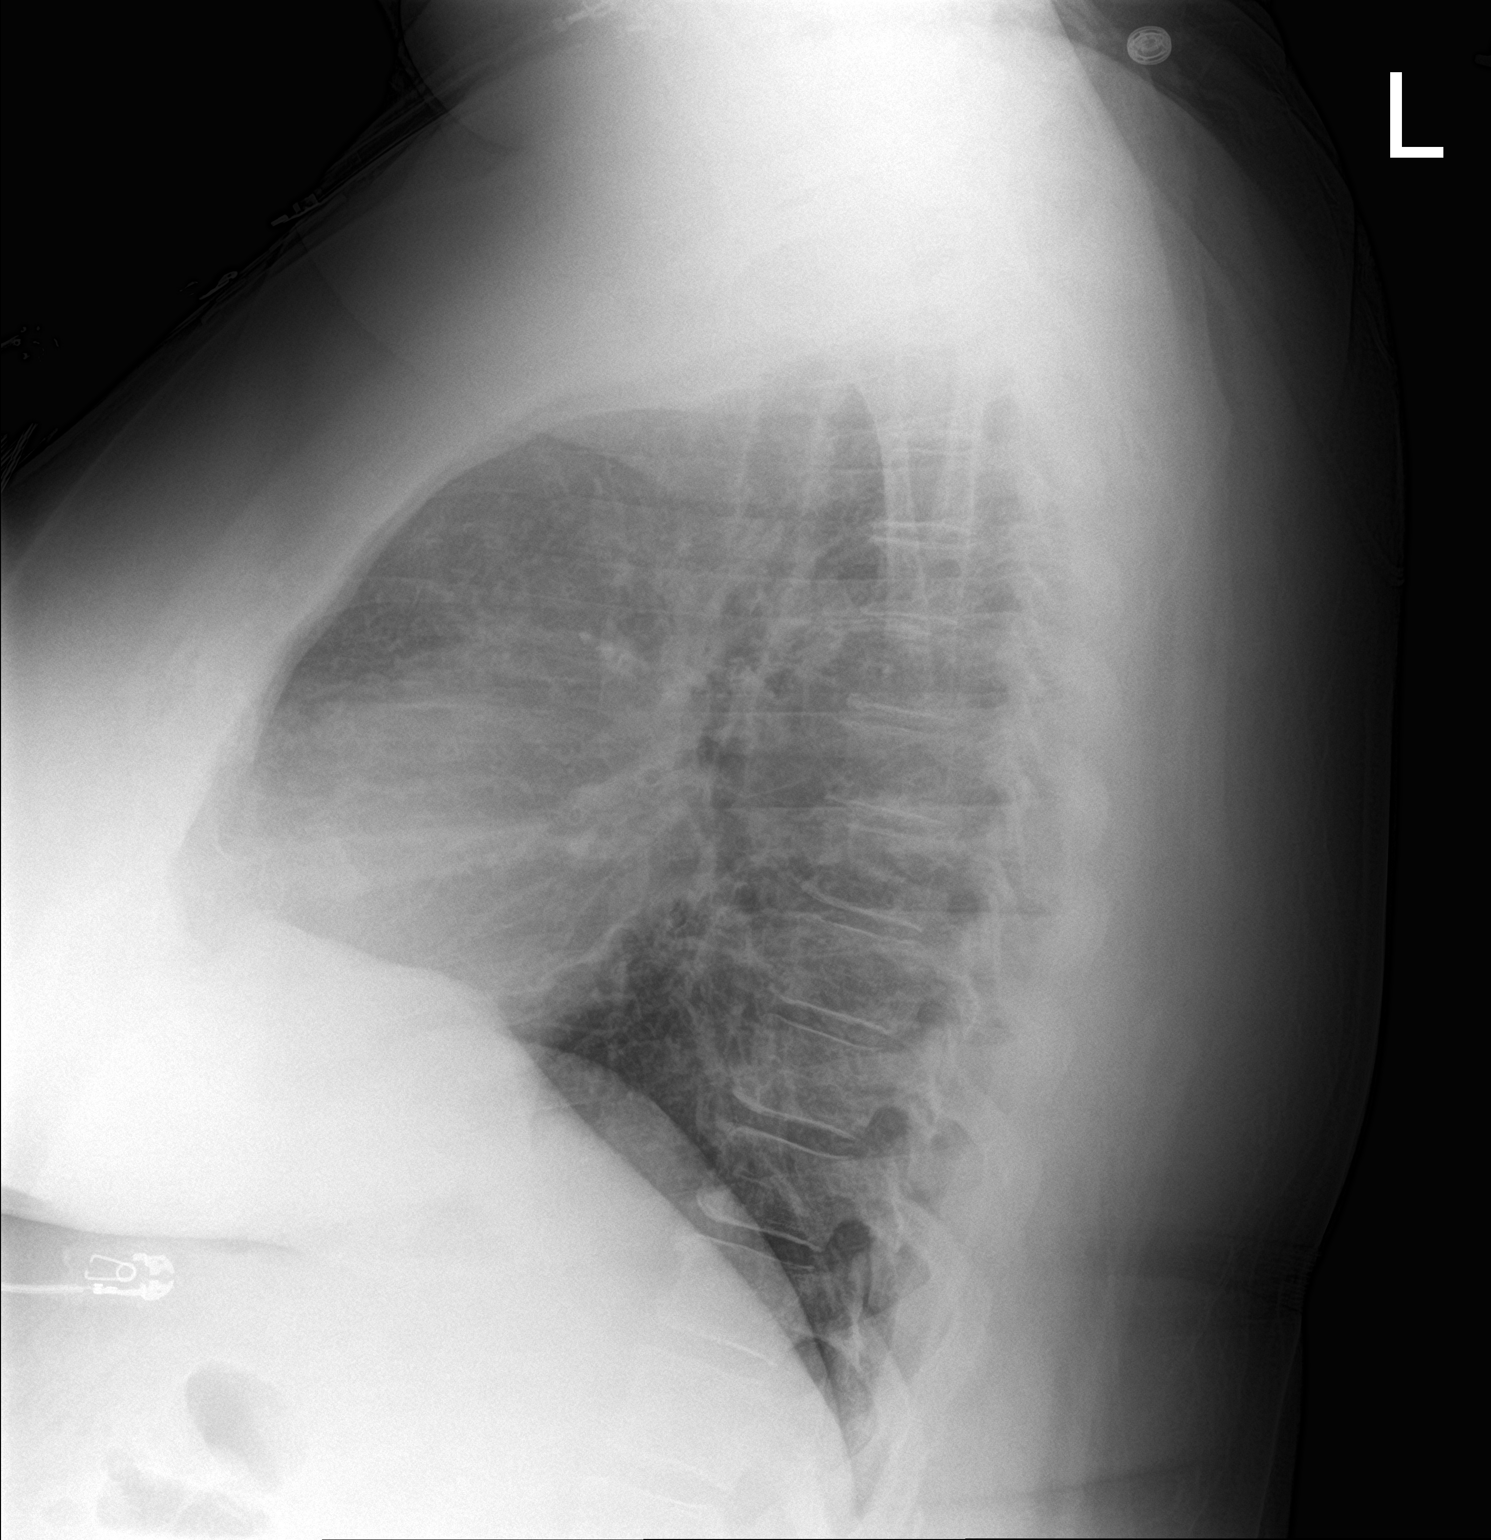

[2 of 2 positions shown; findings below may reference images not displayed]

FINDINGS: Heart and mediastinal contours are within normal limits. No focal
opacities or effusions. No acute bony abnormality.
IMPRESSION: No active cardiopulmonary disease.

## 2017-09-26 ENCOUNTER — Encounter (HOSPITAL_COMMUNITY): Payer: Self-pay | Admitting: *Deleted

## 2017-09-26 ENCOUNTER — Emergency Department (HOSPITAL_COMMUNITY)
Admission: EM | Admit: 2017-09-26 | Discharge: 2017-09-26 | Disposition: A | Discharge status: Home / Self Care | Payer: Self-pay | Attending: Emergency Medicine | Admitting: Emergency Medicine

## 2017-09-26 DIAGNOSIS — K0889 Other specified disorders of teeth and supporting structures: Secondary | ICD-10-CM | POA: Insufficient documentation

## 2017-09-26 DIAGNOSIS — J45909 Unspecified asthma, uncomplicated: Secondary | ICD-10-CM | POA: Insufficient documentation

## 2017-09-26 DIAGNOSIS — F17210 Nicotine dependence, cigarettes, uncomplicated: Secondary | ICD-10-CM | POA: Insufficient documentation

## 2017-09-26 MED ORDER — TRAMADOL HCL 50 MG PO TABS: 50.0000 mg | ORAL_TABLET | Freq: Four times a day (QID) | ORAL | 0 refills | Status: DC | PRN

## 2017-09-26 MED ORDER — CLINDAMYCIN HCL 150 MG PO CAPS: ORAL_CAPSULE | ORAL | 0 refills | Status: DC

## 2017-09-26 NOTE — ED Triage Notes (Signed)
Pt with dental pain for "awhile" and made appt with dentist but unable to be seen until April.  Pt c/o facial pain and lips tingling for past 2 days.

## 2017-09-26 NOTE — ED Provider Notes (Signed)
[facility] EMERGENCY DEPARTMENT Provider Note   CSN: [id number] Arrival date & time: 09/26/17  1034     History   Chief Complaint Chief Complaint  Patient presents with  . Dental Pain    HPI Rachel Hopkins is a 42 y.o. female.  HPI  Rachel Hopkins is a 42 y.o. female who presents to the Emergency Department complaining of left dental pain for several weeks.  He describes an aching pain to her left upper teeth that radiates to her jaw and toward her chin.  She states the pain is been worsening for several days.  She states she tried to make an appointment with her dentist but was unable to get an appointment until April.  She has taken Tylenol without relief.  She states that cold water helps the pain.  She denies facial swelling, neck pain, fever, chills, and difficulty swallowing     Past Medical History:  Diagnosis Date  . Asthma     There are no active problems to display for this patient.   History reviewed. No pertinent surgical history.  OB History    Gravida Para Term Preterm AB Living   1         1   SAB TAB Ectopic Multiple Live Births                   Home Medications    Prior to Admission medications   Medication Sig Start Date End Date Taking? Authorizing Provider  azithromycin (ZITHROMAX) 250 MG tablet 1 po daily 08/26/16   Ivery Quale, PA-C  HYDROcodone-acetaminophen (NORCO/VICODIN) 5-325 MG tablet Take 1 tablet by mouth every 4 (four) hours as needed. 09/06/16   Burgess Amor, PA-C  HYDROcodone-homatropine (HYCODAN) 5-1.5 MG/5ML syrup Take 5 mLs by mouth every 6 (six) hours as needed. 08/26/16   Ivery Quale, PA-C  ibuprofen (ADVIL,MOTRIN) 600 MG tablet Take 1 tablet (600 mg total) by mouth every 6 (six) hours as needed. 08/26/16   Ivery Quale, PA-C  promethazine (PHENERGAN) 25 MG tablet Take 1 tablet (25 mg total) by mouth every 6 (six) hours as needed for nausea or vomiting. 08/26/16   Ivery Quale, PA-C    Family History History  reviewed. No pertinent family history.  Social History Social History   Tobacco Use  . Smoking status: Current Every Day Smoker    Packs/day: 0.50    Types: Cigarettes  . Smokeless tobacco: Never Used  Substance Use Topics  . Alcohol use: Yes    Comment: occasionally  . Drug use: No     Allergies   Catfish [fish allergy]   Review of Systems Review of Systems  Constitutional: Negative for appetite change and fever.  HENT: Positive for dental problem. Negative for congestion, facial swelling, sore throat and trouble swallowing.   Eyes: Negative for pain and visual disturbance.  Musculoskeletal: Negative for neck pain and neck stiffness.  Neurological: Negative for dizziness, facial asymmetry and headaches.  Hematological: Negative for adenopathy.  All other systems reviewed and are negative.    Physical Exam Updated Vital Signs BP (!) 114/98 (BP Location: Left Arm)   Pulse 82   Resp 20   Ht 5\' 6"  (1.676 m)   Wt (!) 163.7 kg (361 lb)   LMP  (LMP Unknown) Comment: irregular per pt  SpO2 98%   BMI 58.27 kg/m   Physical Exam  Constitutional: She is oriented to person, place, and time. She appears well-developed and well-nourished. No distress.  HENT:  Head: Normocephalic and atraumatic.  Right Ear: Tympanic membrane and ear canal normal.  Left Ear: Tympanic membrane and ear canal normal.  Mouth/Throat: Uvula is midline, oropharynx is clear and moist and mucous membranes are normal. No trismus in the jaw. Dental caries present. No dental abscesses or uvula swelling.  ttp and dental caries of the left upper second molar..  No facial swelling, obvious dental abscess, trismus, or sublingual abnml.    Neck: Normal range of motion. Neck supple.  Cardiovascular: Normal rate, regular rhythm and normal heart sounds.  No murmur heard. Pulmonary/Chest: Effort normal and breath sounds normal.  Musculoskeletal: Normal range of motion.  Lymphadenopathy:    She has no cervical  adenopathy.  Neurological: She is alert and oriented to person, place, and time. She exhibits normal muscle tone. Coordination normal.  Skin: Skin is warm and dry.  Nursing note and vitals reviewed.    ED Treatments / Results  Labs (all labs ordered are listed, but only abnormal results are displayed) Labs Reviewed - No data to display  EKG  EKG Interpretation None       Radiology No results found.  Procedures Procedures (including critical care time)  Medications Ordered in ED Medications - No data to display   Initial Impression / Assessment and Plan / ED Course  I have reviewed the triage vital signs and the nursing notes.  Pertinent labs & imaging results that were available during my care of the patient were reviewed by me and considered in my medical decision making (see chart for details).     Patient is well-appearing.  Airway patent.  Dental caries without evidence of abscess or Ludwig's angina.  Patient agrees to plan with antibiotics and follow-up with her dentist.  Final Clinical Impressions(s) / ED Diagnoses   Final diagnoses:  Pain, dental    ED Discharge Orders    None       Rosey Bathriplett, Keyasha Miah, PA-C 09/28/17 Sherrie George1951    Zammit, Joseph, MD 09/28/17 2046

## 2017-09-26 NOTE — Discharge Instructions (Signed)
Take ibuprofen 800 mg twice daily with food.  Call 1 of the dentists to arrange a follow-up appointment

## 2018-03-10 ENCOUNTER — Encounter (HOSPITAL_COMMUNITY): Payer: Self-pay | Admitting: Emergency Medicine

## 2018-03-10 ENCOUNTER — Emergency Department (HOSPITAL_COMMUNITY)
Admission: EM | Admit: 2018-03-10 | Discharge: 2018-03-10 | Disposition: A | Discharge status: Home / Self Care | Payer: Self-pay | Attending: Emergency Medicine | Admitting: Emergency Medicine

## 2018-03-10 ENCOUNTER — Other Ambulatory Visit: Payer: Self-pay

## 2018-03-10 DIAGNOSIS — K047 Periapical abscess without sinus: Secondary | ICD-10-CM | POA: Insufficient documentation

## 2018-03-10 DIAGNOSIS — K029 Dental caries, unspecified: Secondary | ICD-10-CM | POA: Insufficient documentation

## 2018-03-10 DIAGNOSIS — J45909 Unspecified asthma, uncomplicated: Secondary | ICD-10-CM | POA: Insufficient documentation

## 2018-03-10 DIAGNOSIS — F17210 Nicotine dependence, cigarettes, uncomplicated: Secondary | ICD-10-CM | POA: Insufficient documentation

## 2018-03-10 DIAGNOSIS — Z79899 Other long term (current) drug therapy: Secondary | ICD-10-CM | POA: Insufficient documentation

## 2018-03-10 LAB — COMPREHENSIVE METABOLIC PANEL
ALT: 25 U/L (ref 0–44)
AST: 19 U/L (ref 15–41)
Albumin: 3.6 g/dL (ref 3.5–5.0)
Alkaline Phosphatase: 72 U/L (ref 38–126)
Anion gap: 7 (ref 5–15)
BUN: 9 mg/dL (ref 6–20)
CO2: 25 mmol/L (ref 22–32)
Calcium: 8.9 mg/dL (ref 8.9–10.3)
Chloride: 106 mmol/L (ref 98–111)
Creatinine, Ser: 0.77 mg/dL (ref 0.44–1.00)
GFR calc Af Amer: >60 mL/min (ref >60)
GFR calc non Af Amer: >60 mL/min (ref >60)
Glucose, Bld: 118 mg/dL — ABNORMAL HIGH (ref 70–99)
Potassium: 3.8 mmol/L (ref 3.5–5.1)
Sodium: 138 mmol/L (ref 135–145)
Total Bilirubin: 0.6 mg/dL (ref 0.3–1.2)
Total Protein: 7.1 g/dL (ref 6.5–8.1)

## 2018-03-10 LAB — CBC WITH DIFFERENTIAL/PLATELET
Basophils Absolute: 0 10*3/uL (ref 0.0–0.1)
Basophils Relative: 0 %
Eosinophils Absolute: 0.5 10*3/uL (ref 0.0–0.7)
Eosinophils Relative: 5 %
HCT: 46.2 % — ABNORMAL HIGH (ref 36.0–46.0)
Hemoglobin: 14.8 g/dL (ref 12.0–15.0)
Lymphocytes Relative: 21 %
Lymphs Abs: 2.3 10*3/uL (ref 0.7–4.0)
MCH: 29.3 pg (ref 26.0–34.0)
MCHC: 32 g/dL (ref 30.0–36.0)
MCV: 91.5 fL (ref 78.0–100.0)
Monocytes Absolute: 0.9 10*3/uL (ref 0.1–1.0)
Monocytes Relative: 8 %
Neutro Abs: 7.3 10*3/uL (ref 1.7–7.7)
Neutrophils Relative %: 66 %
Platelets: ADEQUATE 10*3/uL (ref 150–400)
RBC: 5.05 MIL/uL (ref 3.87–5.11)
RDW: 13.2 % (ref 11.5–15.5)
WBC: 10.8 10*3/uL — ABNORMAL HIGH (ref 4.0–10.5)

## 2018-03-10 MED ORDER — AMOXICILLIN-POT CLAVULANATE 875-125 MG PO TABS
1.0000 | ORAL_TABLET | Freq: Once | ORAL | Status: AC
  Administered 2018-03-10: 1 via ORAL
  Filled 2018-03-10: qty 1

## 2018-03-10 MED ORDER — CLINDAMYCIN HCL 150 MG PO CAPS: ORAL_CAPSULE | ORAL | 0 refills | Status: DC

## 2018-03-10 MED ORDER — ONDANSETRON HCL 4 MG PO TABS
4.0000 mg | ORAL_TABLET | Freq: Once | ORAL | Status: AC
  Administered 2018-03-10: 4 mg via ORAL
  Filled 2018-03-10: qty 1

## 2018-03-10 MED ORDER — AMOXICILLIN-POT CLAVULANATE 875-125 MG PO TABS: 1.0000 | ORAL_TABLET | Freq: Two times a day (BID) | ORAL | 0 refills | Status: DC

## 2018-03-10 MED ORDER — IBUPROFEN 800 MG PO TABS
800.0000 mg | ORAL_TABLET | Freq: Once | ORAL | Status: AC
  Administered 2018-03-10: 800 mg via ORAL
  Filled 2018-03-10: qty 1

## 2018-03-10 MED ORDER — CLINDAMYCIN HCL 150 MG PO CAPS
300.0000 mg | ORAL_CAPSULE | Freq: Once | ORAL | Status: AC
  Administered 2018-03-10: 300 mg via ORAL
  Filled 2018-03-10: qty 2

## 2018-03-10 MED ORDER — ACETAMINOPHEN 500 MG PO TABS
1000.0000 mg | ORAL_TABLET | Freq: Once | ORAL | Status: AC
  Administered 2018-03-10: 1000 mg via ORAL
  Filled 2018-03-10: qty 2

## 2018-03-10 NOTE — ED Triage Notes (Signed)
Pt C/O left upper sided dental pain. Pt states she is beginning to have trouble swallowing and dizziness. Pt denies N/V. Pt denies fevers.

## 2018-03-10 NOTE — Discharge Instructions (Addendum)
Your temperature was 99.2, and your white blood cell count was slightly elevated tonight.  It is important that you see the oral surgeon as soon as possible for resolution of this dental problem.  Please use 600 mg of ibuprofen, and 1000 mg of Tylenol with breakfast, lunch, dinner, and at bedtime to assist with your pain.  Please use Augmentin and clindamycin with breakfast and supper.  Please call the [facility], as well as [facility] to help establish a primary physician, and also to evaluate your dental infection.

## 2018-03-10 NOTE — ED Provider Notes (Signed)
[facility] EMERGENCY DEPARTMENT Provider Note   CSN: [id number] Arrival date & time: 03/10/18  2035     History   Chief Complaint Chief Complaint  Patient presents with  . Dental Pain    HPI Rachel Hopkins is a 42 y.o. female.  Patient is a 42 year old female who presents to the emergency department with a complaint of dental pain, facial pain, and lightheadedness.  The patient states that she has had problems with dental caries for quite some time.  She has been told that she needs to be evaluated and treated by an oral surgeon.  She says that at this time she and her husband cannot afford to see oral surgeon.  She has been trying to fight the infected teeth with salt water swishes, and over-the-counter medications.  She says now she has pain from her maxillary area up to the area under her eye, and also below her ear on the left side.  She says at times she has some problems with swallowing, because it causes so much pain.  She can swallow liquids and solids, but they cause her pain.  She has been bothered with headache over the last 2 days.  She says it causes her to be lightheaded, and sensitive to light and sound.  She presents to the emergency department for evaluation and assistance with this issue.  The history is provided by the patient.    Past Medical History:  Diagnosis Date  . Asthma     There are no active problems to display for this patient.   History reviewed. No pertinent surgical history.   OB History    Gravida  1   Para      Term      Preterm      AB      Living  1     SAB      TAB      Ectopic      Multiple      Live Births               Home Medications    Prior to Admission medications   Medication Sig Start Date End Date Taking? Authorizing Provider  azithromycin (ZITHROMAX) 250 MG tablet 1 po daily 08/26/16   Ivery Quale, PA-C  clindamycin (CLEOCIN) 150 MG capsule 2 capsules by mouth 4 times a day for 7 days 09/26/17    Triplett, Tammy, PA-C  HYDROcodone-acetaminophen (NORCO/VICODIN) 5-325 MG tablet Take 1 tablet by mouth every 4 (four) hours as needed. 09/06/16   Burgess Amor, PA-C  HYDROcodone-homatropine (HYCODAN) 5-1.5 MG/5ML syrup Take 5 mLs by mouth every 6 (six) hours as needed. 08/26/16   Ivery Quale, PA-C  ibuprofen (ADVIL,MOTRIN) 600 MG tablet Take 1 tablet (600 mg total) by mouth every 6 (six) hours as needed. 08/26/16   Ivery Quale, PA-C  promethazine (PHENERGAN) 25 MG tablet Take 1 tablet (25 mg total) by mouth every 6 (six) hours as needed for nausea or vomiting. 08/26/16   Ivery Quale, PA-C  traMADol (ULTRAM) 50 MG tablet Take 1 tablet (50 mg total) by mouth every 6 (six) hours as needed. 09/26/17   Triplett, Babette Relic, PA-C    Family History No family history on file.  Social History Social History   Tobacco Use  . Smoking status: Current Every Day Smoker    Packs/day: 0.50    Types: Cigarettes  . Smokeless tobacco: Never Used  Substance Use Topics  . Alcohol use: Yes  Comment: occasionally  . Drug use: Yes    Types: Marijuana     Allergies   Catfish [fish allergy]   Review of Systems Review of Systems  Constitutional: Negative for activity change, chills and fever.       All ROS Neg except as noted in HPI  HENT: Positive for dental problem. Negative for drooling and nosebleeds.        Facial pain  Eyes: Negative for photophobia and discharge.  Respiratory: Negative for cough, shortness of breath and wheezing.   Cardiovascular: Negative for chest pain and palpitations.  Gastrointestinal: Negative for abdominal pain and blood in stool.  Genitourinary: Negative for dysuria, frequency and hematuria.  Musculoskeletal: Negative for arthralgias, back pain and neck pain.  Skin: Negative.   Neurological: Positive for light-headedness and headaches. Negative for dizziness, seizures and speech difficulty.  Psychiatric/Behavioral: Negative for confusion and hallucinations.      Physical Exam Updated Vital Signs BP (!) 133/95 (BP Location: Right Arm)   Pulse 78   Temp 99.2 F (37.3 C) (Oral)   Resp 20   Ht 5\' 7"  (1.702 m)   Wt (!) 149.7 kg   LMP 02/24/2018   SpO2 97%   BMI 51.69 kg/m   Physical Exam  Constitutional: She is oriented to person, place, and time. She appears well-developed and well-nourished.  Non-toxic appearance.  Chaperone present during the examination.  HENT:  Head: Normocephalic.  Right Ear: Tympanic membrane and external ear normal.  Left Ear: Tympanic membrane and external ear normal.  There are multiple dental caries on the right and on the left.  On the left there are deep cavities of the upper and lower jaw area.  There is swelling of the gum of the upper jaw area on the left.  The airway is patent.  There is no swelling under the tongue.  The patient has no problem mobilizing secretions.  Eyes: Pupils are equal, round, and reactive to light. EOM and lids are normal.  Neck: Normal range of motion. Neck supple. Carotid bruit is not present.  Cardiovascular: Normal rate, regular rhythm, normal heart sounds, intact distal pulses and normal pulses.  Pulmonary/Chest: Breath sounds normal. No stridor. No respiratory distress. She has no wheezes.  Abdominal: Soft. Bowel sounds are normal. There is no tenderness. There is no guarding.  Musculoskeletal: Normal range of motion.  Lymphadenopathy:       Head (right side): No submandibular adenopathy present.       Head (left side): No submandibular adenopathy present.    She has no cervical adenopathy.  Neurological: She is alert and oriented to person, place, and time. She has normal strength. No cranial nerve deficit or sensory deficit.  Skin: Skin is warm and dry.  Psychiatric: She has a normal mood and affect. Her speech is normal.  Nursing note and vitals reviewed.    ED Treatments / Results  Labs (all labs ordered are listed, but only abnormal results are displayed) Labs  Reviewed  CBC WITH DIFFERENTIAL/PLATELET - Abnormal; Notable for the following components:      Result Value   WBC 10.8 (*)    HCT 46.2 (*)    All other components within normal limits  COMPREHENSIVE METABOLIC PANEL - Abnormal; Notable for the following components:   Glucose, Bld 118 (*)    All other components within normal limits    EKG None  Radiology No results found.  Procedures Procedures (including critical care time)  Medications Ordered in ED Medications  ibuprofen (ADVIL,MOTRIN) tablet 800 mg (has no administration in time range)  acetaminophen (TYLENOL) tablet 1,000 mg (has no administration in time range)  clindamycin (CLEOCIN) capsule 300 mg (has no administration in time range)  amoxicillin-clavulanate (AUGMENTIN) 875-125 MG per tablet 1 tablet (has no administration in time range)  ondansetron (ZOFRAN) tablet 4 mg (has no administration in time range)     Initial Impression / Assessment and Plan / ED Course  I have reviewed the triage vital signs and the nursing notes.  Pertinent labs & imaging results that were available during my care of the patient were reviewed by me and considered in my medical decision making (see chart for details).       Final Clinical Impressions(s) / ED Diagnoses mdm Vital signs reviewed.  Pulse oximetry is 97% on room air.  Within normal limits by my interpretation.  No evidence of trismus at this time.  No evidence for Ludewig's angina.  The patient has multiple dental caries, accompanied by swelling of the upper gum.  There is facial tenderness on the left to palpation.  Patient will be treated with Augmentin and clindamycin.  I have explained to the patient that she will need to be seen by the oral surgeon as soon as possible for resolution of this problem.  I also discussed with her to use 600 mg of ibuprofen, and at thousand milligrams of Tylenol with each meal and at bedtime.  The patient was supplied an ice pack to use for  some of her discomfort as well.  Resource information was given to the patient for the [facility], as well as [facility] to establish a primary physician for follow-up of this dental issue and to assist with her pain management.  Patient acknowledges understanding of these instructions.   Final diagnoses:  Dental infection    ED Discharge Orders         Ordered    clindamycin (CLEOCIN) 150 MG capsule     03/10/18 2302    amoxicillin-clavulanate (AUGMENTIN) 875-125 MG tablet  Every 12 hours     03/10/18 2302           Ivery QualeBryant, Jozey Janco, PA-C 03/10/18 2315    Raeford RazorKohut, Stephen, MD 03/11/18 1450

## 2018-06-10 ENCOUNTER — Emergency Department (HOSPITAL_COMMUNITY)
Admission: EM | Admit: 2018-06-10 | Discharge: 2018-06-10 | Disposition: A | Discharge status: Home / Self Care | Payer: Self-pay | Attending: Emergency Medicine | Admitting: Emergency Medicine

## 2018-06-10 ENCOUNTER — Emergency Department (HOSPITAL_COMMUNITY): Payer: Self-pay

## 2018-06-10 ENCOUNTER — Encounter (HOSPITAL_COMMUNITY): Payer: Self-pay | Admitting: Emergency Medicine

## 2018-06-10 ENCOUNTER — Other Ambulatory Visit: Payer: Self-pay

## 2018-06-10 DIAGNOSIS — R519 Headache, unspecified: Secondary | ICD-10-CM

## 2018-06-10 DIAGNOSIS — F17210 Nicotine dependence, cigarettes, uncomplicated: Secondary | ICD-10-CM | POA: Insufficient documentation

## 2018-06-10 DIAGNOSIS — J45909 Unspecified asthma, uncomplicated: Secondary | ICD-10-CM | POA: Insufficient documentation

## 2018-06-10 DIAGNOSIS — R51 Headache: Secondary | ICD-10-CM | POA: Insufficient documentation

## 2018-06-10 MED ORDER — SODIUM CHLORIDE 0.9 % IV BOLUS
1000.0000 mL | Freq: Once | INTRAVENOUS | Status: AC
  Administered 2018-06-10: 1000 mL via INTRAVENOUS

## 2018-06-10 MED ORDER — KETOROLAC TROMETHAMINE 30 MG/ML IJ SOLN
30.0000 mg | Freq: Once | INTRAMUSCULAR | Status: AC
  Administered 2018-06-10: 30 mg via INTRAVENOUS
  Filled 2018-06-10: qty 1

## 2018-06-10 MED ORDER — DIPHENHYDRAMINE HCL 50 MG/ML IJ SOLN
25.0000 mg | Freq: Once | INTRAMUSCULAR | Status: AC
  Administered 2018-06-10: 25 mg via INTRAVENOUS
  Filled 2018-06-10: qty 1

## 2018-06-10 MED ORDER — METOCLOPRAMIDE HCL 5 MG/ML IJ SOLN
10.0000 mg | Freq: Once | INTRAMUSCULAR | Status: AC
  Administered 2018-06-10: 10 mg via INTRAVENOUS
  Filled 2018-06-10: qty 2

## 2018-06-10 NOTE — ED Triage Notes (Addendum)
Patient complaining of headache x 2 days. Denies vomiting or history of migraines. Patient also complaining of bilateral leg pain upon awakening today.

## 2018-06-10 NOTE — Discharge Instructions (Addendum)
Head scan showed no acute findings.  Rest.  Follow-up with your primary care doctor.

## 2018-06-10 NOTE — ED Notes (Signed)
Patient transported back from X-ray 

## 2018-06-11 ENCOUNTER — Emergency Department (HOSPITAL_COMMUNITY): Payer: Self-pay

## 2018-06-11 ENCOUNTER — Encounter (HOSPITAL_COMMUNITY): Payer: Self-pay

## 2018-06-11 ENCOUNTER — Other Ambulatory Visit: Payer: Self-pay

## 2018-06-11 ENCOUNTER — Emergency Department (HOSPITAL_COMMUNITY)
Admission: EM | Admit: 2018-06-11 | Discharge: 2018-06-11 | Disposition: A | Discharge status: Home / Self Care | Payer: Self-pay | Attending: Emergency Medicine | Admitting: Emergency Medicine

## 2018-06-11 DIAGNOSIS — J45909 Unspecified asthma, uncomplicated: Secondary | ICD-10-CM | POA: Insufficient documentation

## 2018-06-11 DIAGNOSIS — M79604 Pain in right leg: Secondary | ICD-10-CM

## 2018-06-11 DIAGNOSIS — F17210 Nicotine dependence, cigarettes, uncomplicated: Secondary | ICD-10-CM | POA: Insufficient documentation

## 2018-06-11 DIAGNOSIS — M79605 Pain in left leg: Secondary | ICD-10-CM | POA: Insufficient documentation

## 2018-06-11 DIAGNOSIS — M79661 Pain in right lower leg: Secondary | ICD-10-CM | POA: Insufficient documentation

## 2018-06-11 DIAGNOSIS — R52 Pain, unspecified: Secondary | ICD-10-CM

## 2018-06-11 LAB — CBC WITH DIFFERENTIAL/PLATELET
Abs Immature Granulocytes: 0.03 10*3/uL (ref 0.00–0.07)
Basophils Absolute: 0 10*3/uL (ref 0.0–0.1)
Basophils Relative: 0 %
Eosinophils Absolute: 0.2 10*3/uL (ref 0.0–0.5)
Eosinophils Relative: 2 %
HEMATOCRIT: 46.2 % — AB (ref 36.0–46.0)
Hemoglobin: 14.4 g/dL (ref 12.0–15.0)
Immature Granulocytes: 0 %
LYMPHS ABS: 2.1 10*3/uL (ref 0.7–4.0)
Lymphocytes Relative: 17 %
MCH: 28.3 pg (ref 26.0–34.0)
MCHC: 31.2 g/dL (ref 30.0–36.0)
MCV: 90.8 fL (ref 80.0–100.0)
MONOS PCT: 7 %
Monocytes Absolute: 0.8 10*3/uL (ref 0.1–1.0)
NEUTROS ABS: 8.8 10*3/uL — AB (ref 1.7–7.7)
NEUTROS PCT: 74 %
NRBC: 0 % (ref 0.0–0.2)
PLATELETS: 178 10*3/uL (ref 150–400)
RBC: 5.09 MIL/uL (ref 3.87–5.11)
RDW: 12.9 % (ref 11.5–15.5)
WBC: 11.9 10*3/uL — ABNORMAL HIGH (ref 4.0–10.5)

## 2018-06-11 LAB — BASIC METABOLIC PANEL
ANION GAP: 8 (ref 5–15)
BUN: 11 mg/dL (ref 6–20)
CHLORIDE: 106 mmol/L (ref 98–111)
CO2: 24 mmol/L (ref 22–32)
Calcium: 9.3 mg/dL (ref 8.9–10.3)
Creatinine, Ser: 0.85 mg/dL (ref 0.44–1.00)
GFR calc non Af Amer: >60 mL/min (ref >60)
GLUCOSE: 89 mg/dL (ref 70–99)
POTASSIUM: 3.8 mmol/L (ref 3.5–5.1)
Sodium: 138 mmol/L (ref 135–145)

## 2018-06-11 LAB — CK: Total CK: 81 U/L (ref 38–234)

## 2018-06-11 NOTE — ED Provider Notes (Signed)
[facility] EMERGENCY DEPARTMENT Provider Note   CSN: [id number] Arrival date & time: 06/10/18  [id number]     History   Chief Complaint Chief Complaint  Patient presents with  . Headache    HPI Rachel BeachSarah M Steele is a 42 y.o. female.  Headache for the past 2 days located behind the left eye.  No visual disturbance, facial asymmetry, arm or leg numbness, any other obvious neurological deficits.  She does not normally get headaches.  No fever or chills.  She is normally healthy.  Severity is moderate.  Nothing makes symptoms better or worse.     Past Medical History:  Diagnosis Date  . Asthma     There are no active problems to display for this patient.   History reviewed. No pertinent surgical history.   OB History    Gravida  1   Para      Term      Preterm      AB      Living  1     SAB      TAB      Ectopic      Multiple      Live Births               Home Medications    Prior to Admission medications   Medication Sig Start Date End Date Taking? Authorizing Provider  ibuprofen (ADVIL,MOTRIN) 600 MG tablet Take 1 tablet (600 mg total) by mouth every 6 (six) hours as needed. 08/26/16   Ivery QualeBryant, Hobson, PA-C  promethazine (PHENERGAN) 25 MG tablet Take 1 tablet (25 mg total) by mouth every 6 (six) hours as needed for nausea or vomiting. 08/26/16   Ivery QualeBryant, Hobson, PA-C  traMADol (ULTRAM) 50 MG tablet Take 1 tablet (50 mg total) by mouth every 6 (six) hours as needed. 09/26/17   Pauline Ausriplett, Tammy, PA-C    Family History History reviewed. No pertinent family history.  Social History Social History   Tobacco Use  . Smoking status: Current Every Day Smoker    Packs/day: 0.50    Types: Cigarettes  . Smokeless tobacco: Never Used  Substance Use Topics  . Alcohol use: Yes    Comment: occasionally  . Drug use: Yes    Types: Marijuana     Allergies   Catfish [fish allergy]   Review of Systems Review of Systems  All other systems reviewed and  are negative.    Physical Exam Updated Vital Signs BP 124/88 (BP Location: Left Leg)   Pulse 74   Temp 98.2 F (36.8 C) (Oral)   Resp 18   Ht 5\' 6"  (1.676 m)   Wt 111.1 kg   LMP 04/15/2018   SpO2 98%   BMI 39.54 kg/m   Physical Exam  Constitutional: She is oriented to person, place, and time. She appears well-developed and well-nourished.  Overweight.  HENT:  Head: Normocephalic and atraumatic.  Eyes: Pupils are equal, round, and reactive to light. Conjunctivae and EOM are normal.  Neck: Neck supple.  Cardiovascular: Normal rate and regular rhythm.  Pulmonary/Chest: Effort normal and breath sounds normal.  Abdominal: Soft. Bowel sounds are normal.  Musculoskeletal: Normal range of motion.  Neurological: She is alert and oriented to person, place, and time.  Skin: Skin is warm and dry.  Psychiatric: She has a normal mood and affect. Her behavior is normal.  Nursing note and vitals reviewed.    ED Treatments / Results  Labs (all labs ordered are listed, but  only abnormal results are displayed) Labs Reviewed - No data to display  EKG None  Radiology Ct Head Wo Contrast  Result Date: 06/10/2018 CLINICAL DATA:  Headache for 4 days EXAM: CT HEAD WITHOUT CONTRAST TECHNIQUE: Contiguous axial images were obtained from the base of the skull through the vertex without intravenous contrast. COMPARISON:  None. FINDINGS: Brain: The ventricles are normal in size and configuration. There is mild invagination of CSF into the sella. There is no intracranial mass, hemorrhage, extra-axial fluid collection, or midline shift. Brain parenchyma appears unremarkable. No evident acute infarct. Vascular: No hyperdense vessel. There is slight calcification in the right carotid siphon region. Skull: The bony calvarium a appears intact. Sinuses/Orbits: Visualized paranasal sinuses are clear. Orbits appear symmetric bilaterally. Other: Visualized mastoid air cells are clear. IMPRESSION: Mild  invagination of CSF into the sella, a finding of questionable clinical significance. The ventricles appear normal. Brain parenchyma appears normal. No mass or hemorrhage. Slight arterial vascular calcification noted. Electronically Signed   By: Bretta Bang III M.D.   On: 06/10/2018 12:10    Procedures Procedures (including critical care time)  Medications Ordered in ED Medications  sodium chloride 0.9 % bolus 1,000 mL (0 mLs Intravenous Stopped 06/10/18 1123)  ketorolac (TORADOL) 30 MG/ML injection 30 mg (30 mg Intravenous Given 06/10/18 1009)  metoCLOPramide (REGLAN) injection 10 mg (10 mg Intravenous Given 06/10/18 1009)  diphenhydrAMINE (BENADRYL) injection 25 mg (25 mg Intravenous Given 06/10/18 1009)     Initial Impression / Assessment and Plan / ED Course  I have reviewed the triage vital signs and the nursing notes.  Pertinent labs & imaging results that were available during my care of the patient were reviewed by me and considered in my medical decision making (see chart for details).     Patient presents with headache behind the left eye.  Normal neuro exam.  CT head shows questionable empty sella syndrome.  These findings were discussed with the patient and her husband.  She responded well to IV fluids, IV Toradol, IV Benadryl, IV Reglan.  Final Clinical Impressions(s) / ED Diagnoses   Final diagnoses:  Acute intractable headache, unspecified headache type    ED Discharge Orders    None       Donnetta Hutching, MD 06/11/18 [phone]

## 2018-06-11 NOTE — Discharge Instructions (Addendum)
Ibuprofen 600 mg every 6 hours as needed for pain.  Rest.  Follow-up with a primary doctor if symptoms are not improving in the next week, and return to the ER if symptoms significantly worsen or change.

## 2018-06-11 NOTE — ED Provider Notes (Signed)
[facility] EMERGENCY DEPARTMENT Provider Note   CSN: [id number] Arrival date & time: 06/11/18  1209     History   Chief Complaint Chief Complaint  Patient presents with  . Leg Pain    HPI Rachel Hopkins is a 42 y.o. female.  Patient is a 42 year old female with history of morbid obesity, asthma.  She presents today for evaluation of leg pain.  She states that she has been having pain in her right leg from her calf through to her foot and her left ankle and foot for the past several days.  This is worse with ambulation and bearing weight.  She denies any specific injury or trauma or having performed any new or strenuous activities.  She denies any chest pain or difficulty breathing.  She denies increased swelling.  The history is provided by the patient.  Leg Pain   This is a new problem. Episode onset: 3 days ago. The problem occurs constantly. The problem has been gradually worsening. The pain is moderate. Pertinent negatives include no numbness and no tingling. The symptoms are aggravated by standing and activity. She has tried nothing for the symptoms. There has been no history of extremity trauma.    Past Medical History:  Diagnosis Date  . Asthma     There are no active problems to display for this patient.   History reviewed. No pertinent surgical history.   OB History    Gravida  1   Para      Term      Preterm      AB      Living  1     SAB      TAB      Ectopic      Multiple      Live Births               Home Medications    Prior to Admission medications   Medication Sig Start Date End Date Taking? Authorizing Provider  ibuprofen (ADVIL,MOTRIN) 600 MG tablet Take 1 tablet (600 mg total) by mouth every 6 (six) hours as needed. 08/26/16   Ivery Quale, PA-C  promethazine (PHENERGAN) 25 MG tablet Take 1 tablet (25 mg total) by mouth every 6 (six) hours as needed for nausea or vomiting. 08/26/16   Ivery Quale, PA-C  traMADol  (ULTRAM) 50 MG tablet Take 1 tablet (50 mg total) by mouth every 6 (six) hours as needed. 09/26/17   Triplett, Babette Relic, PA-C    Family History No family history on file.  Social History Social History   Tobacco Use  . Smoking status: Current Every Day Smoker    Packs/day: 0.50    Types: Cigarettes  . Smokeless tobacco: Never Used  Substance Use Topics  . Alcohol use: Yes    Comment: occasionally  . Drug use: Yes    Types: Marijuana     Allergies   Catfish [fish allergy]   Review of Systems Review of Systems  Constitutional: Negative for fever.       10 Systems reviewed and are negative for acute change except as noted in the HPI.  HENT: Negative for congestion.   Eyes: Negative for discharge and redness.  Respiratory: Negative for cough and shortness of breath.   Cardiovascular: Negative for chest pain.  Gastrointestinal: Negative for abdominal pain and vomiting.  Musculoskeletal: Negative for back pain.  Skin: Negative for rash.  Neurological: Negative for tingling, syncope, numbness and headaches.  Psychiatric/Behavioral:  No behavior change.     Physical Exam Updated Vital Signs BP (!) 136/95 (BP Location: Right Arm)   Pulse 95   Temp 99.7 F (37.6 C) (Oral)   Resp 18   Ht 5\' 6"  (1.676 m)   Wt 111.1 kg   SpO2 97%   BMI 39.54 kg/m   Physical Exam  Constitutional: She is oriented to person, place, and time. She appears well-developed and well-nourished. No distress.  HENT:  Head: Normocephalic and atraumatic.  Neck: Normal range of motion. Neck supple.  Cardiovascular: Normal rate and regular rhythm. Exam reveals no gallop and no friction rub.  No murmur heard. Pulmonary/Chest: Effort normal and breath sounds normal. No respiratory distress. She has no wheezes.  Abdominal: Soft. Bowel sounds are normal. She exhibits no distension. There is no tenderness.  Musculoskeletal: Normal range of motion. She exhibits no edema.  Both lower extremities are  obese, but without edema.  DP pulses are palpable and motor and sensation are intact throughout.  There is no calf tenderness and Homans sign is absent bilaterally.  Neurological: She is alert and oriented to person, place, and time.  Skin: Skin is warm and dry. She is not diaphoretic.  Nursing note and vitals reviewed.    ED Treatments / Results  Labs (all labs ordered are listed, but only abnormal results are displayed) Labs Reviewed  BASIC METABOLIC PANEL  CBC WITH DIFFERENTIAL/PLATELET  CK    EKG None  Radiology Ct Head Wo Contrast  Result Date: 06/10/2018 CLINICAL DATA:  Headache for 4 days EXAM: CT HEAD WITHOUT CONTRAST TECHNIQUE: Contiguous axial images were obtained from the base of the skull through the vertex without intravenous contrast. COMPARISON:  None. FINDINGS: Brain: The ventricles are normal in size and configuration. There is mild invagination of CSF into the sella. There is no intracranial mass, hemorrhage, extra-axial fluid collection, or midline shift. Brain parenchyma appears unremarkable. No evident acute infarct. Vascular: No hyperdense vessel. There is slight calcification in the right carotid siphon region. Skull: The bony calvarium a appears intact. Sinuses/Orbits: Visualized paranasal sinuses are clear. Orbits appear symmetric bilaterally. Other: Visualized mastoid air cells are clear. IMPRESSION: Mild invagination of CSF into the sella, a finding of questionable clinical significance. The ventricles appear normal. Brain parenchyma appears normal. No mass or hemorrhage. Slight arterial vascular calcification noted. Electronically Signed   By: Bretta BangWilliam  Woodruff III M.D.   On: 06/10/2018 12:10    Procedures Procedures (including critical care time)  Medications Ordered in ED Medications - No data to display   Initial Impression / Assessment and Plan / ED Course  I have reviewed the triage vital signs and the nursing notes.  Pertinent labs & imaging  results that were available during my care of the patient were reviewed by me and considered in my medical decision making (see chart for details).  Patient presents here with bilateral leg pain.  Her physical examination is unremarkable.  Her legs are well perfused and ultrasound shows no evidence for DVT.  Her laboratory studies show no electrolyte abnormality and blood counts are essentially unremarkable.  She has a normal total CK.  At this point, I see nothing emergent and feel as though she is appropriate for discharge.  She will be discharged with ibuprofen, rest, and follow-up as needed.  Final Clinical Impressions(s) / ED Diagnoses   Final diagnoses:  None    ED Discharge Orders    None       Geoffery Lyonselo, Alexsis Branscom, MD 06/11/18  1500  

## 2018-06-11 NOTE — ED Triage Notes (Signed)
Pt reports bilateral lower leg for 4 days. No edema or redness noted

## 2018-09-06 ENCOUNTER — Other Ambulatory Visit: Payer: Self-pay

## 2018-09-06 ENCOUNTER — Emergency Department (HOSPITAL_COMMUNITY)
Admission: EM | Admit: 2018-09-06 | Discharge: 2018-09-06 | Disposition: A | Discharge status: Home / Self Care | Payer: Self-pay | Attending: Emergency Medicine | Admitting: Emergency Medicine

## 2018-09-06 ENCOUNTER — Encounter (HOSPITAL_COMMUNITY): Payer: Self-pay | Admitting: Emergency Medicine

## 2018-09-06 DIAGNOSIS — F17210 Nicotine dependence, cigarettes, uncomplicated: Secondary | ICD-10-CM | POA: Insufficient documentation

## 2018-09-06 DIAGNOSIS — K047 Periapical abscess without sinus: Secondary | ICD-10-CM | POA: Insufficient documentation

## 2018-09-06 DIAGNOSIS — I10 Essential (primary) hypertension: Secondary | ICD-10-CM | POA: Insufficient documentation

## 2018-09-06 DIAGNOSIS — J45909 Unspecified asthma, uncomplicated: Secondary | ICD-10-CM | POA: Insufficient documentation

## 2018-09-06 HISTORY — DX: Essential (primary) hypertension: I10

## 2018-09-06 MED ORDER — HYDROCODONE-ACETAMINOPHEN 5-325 MG PO TABS: ORAL_TABLET | ORAL | 0 refills | Status: DC

## 2018-09-06 MED ORDER — HYDROCODONE-ACETAMINOPHEN 5-325 MG PO TABS
1.0000 | ORAL_TABLET | Freq: Once | ORAL | Status: AC
  Administered 2018-09-06: 1 via ORAL
  Filled 2018-09-06: qty 1

## 2018-09-06 MED ORDER — PENICILLIN V POTASSIUM 500 MG PO TABS: 500.0000 mg | ORAL_TABLET | Freq: Three times a day (TID) | ORAL | 0 refills | Status: DC

## 2018-09-06 MED ORDER — BENZOCAINE 20 % MT AERO
INHALATION_SPRAY | Freq: Once | OROMUCOSAL | Status: AC
  Administered 2018-09-06: 1 via OROMUCOSAL
  Filled 2018-09-06: qty 57

## 2018-09-06 MED ORDER — PENICILLIN V POTASSIUM 250 MG PO TABS
500.0000 mg | ORAL_TABLET | Freq: Once | ORAL | Status: AC
  Administered 2018-09-06: 500 mg via ORAL
  Filled 2018-09-06: qty 2

## 2018-09-06 NOTE — ED Notes (Signed)
ED Provider at bedside. 

## 2018-09-06 NOTE — ED Provider Notes (Signed)
[facility] EMERGENCY DEPARTMENT Provider Note   CSN: [id number] Arrival date & time: 09/06/18  1834    History   Chief Complaint Chief Complaint  Patient presents with  . Dental Pain    HPI Rachel Hopkins is a 43 y.o. female.     HPI   Rachel Hopkins is a 43 y.o. female who presents to the Emergency Department complaining of recurrent dental pain.  She states that she has had ongoing pain to her left face for several days.  She states that she woke up on the morning of arrival and noticed swelling to her left upper jaw that has progressed throughout the day.  She states that she has several "bad teeth" and needs to see a dentist, but she is unable to afford a visit.  She describes a sharp throbbing pain to her left face that radiates to her ear.  Pain is worse with attempted chewing and cold liquids.  She denies fever, chills, neck pain, and difficulty swallowing or breathing.  Past Medical History:  Diagnosis Date  . Asthma   . Hypertension     There are no active problems to display for this patient.   History reviewed. No pertinent surgical history.   OB History    Gravida  1   Para      Term      Preterm      AB      Living  1     SAB      TAB      Ectopic      Multiple      Live Births               Home Medications    Prior to Admission medications   Medication Sig Start Date End Date Taking? Authorizing Provider  ibuprofen (ADVIL,MOTRIN) 600 MG tablet Take 1 tablet (600 mg total) by mouth every 6 (six) hours as needed. Patient not taking: Reported on 06/11/2018 08/26/16   Ivery Quale, PA-C  promethazine (PHENERGAN) 25 MG tablet Take 1 tablet (25 mg total) by mouth every 6 (six) hours as needed for nausea or vomiting. Patient not taking: Reported on 06/11/2018 08/26/16   Ivery Quale, PA-C  traMADol (ULTRAM) 50 MG tablet Take 1 tablet (50 mg total) by mouth every 6 (six) hours as needed. Patient not taking: Reported on  06/11/2018 09/26/17   Pauline Aus, PA-C    Family History History reviewed. No pertinent family history.  Social History Social History   Tobacco Use  . Smoking status: Current Every Day Smoker    Packs/day: 0.50    Types: Cigarettes  . Smokeless tobacco: Never Used  Substance Use Topics  . Alcohol use: Yes    Comment: occasionally  . Drug use: Yes    Types: Marijuana     Allergies   Catfish [fish allergy]   Review of Systems Review of Systems  Constitutional: Negative for appetite change and fever.  HENT: Positive for dental problem and facial swelling. Negative for congestion, sore throat and trouble swallowing.   Eyes: Negative for pain and visual disturbance.  Musculoskeletal: Negative for neck pain and neck stiffness.  Neurological: Negative for dizziness, facial asymmetry and headaches.  Hematological: Negative for adenopathy.     Physical Exam Updated Vital Signs BP (!) 156/97 (BP Location: Right Arm)   Pulse 89   Temp 98.7 F (37.1 C) (Oral)   Resp 18   Ht 5\' 7"  (1.702 m)  Wt (!) 156.5 kg   LMP 08/11/2018   SpO2 99%   BMI 54.03 kg/m   Physical Exam Vitals signs and nursing note reviewed.  Constitutional:      General: She is not in acute distress.    Appearance: She is well-developed.  HENT:     Head: Normocephalic and atraumatic.     Jaw: No trismus.     Right Ear: Tympanic membrane and ear canal normal.     Left Ear: Tympanic membrane and ear canal normal.     Mouth/Throat:     Mouth: Mucous membranes are moist.     Dentition: Dental tenderness, gingival swelling and dental caries present. No dental abscesses.     Pharynx: Uvula midline. No uvula swelling.     Tonsils: No tonsillar exudate or tonsillar abscesses.     Comments: Widespread dental caries with tenderness to palpation of the left upper first molar.  There is adjacent fluctuance and swelling of the gingiva.  Mild to moderate left-sided facial swelling as well. Neck:      Musculoskeletal: Normal range of motion and neck supple.  Cardiovascular:     Rate and Rhythm: Normal rate and regular rhythm.     Heart sounds: Normal heart sounds. No murmur.  Pulmonary:     Effort: Pulmonary effort is normal.     Breath sounds: Normal breath sounds.  Musculoskeletal: Normal range of motion.  Lymphadenopathy:     Cervical: No cervical adenopathy.  Skin:    General: Skin is warm and dry.  Neurological:     Mental Status: She is alert and oriented to person, place, and time.     Motor: No abnormal muscle tone.     Coordination: Coordination normal.      ED Treatments / Results  Labs (all labs ordered are listed, but only abnormal results are displayed) Labs Reviewed - No data to display  EKG None  Radiology No results found.  Procedures Procedures (including critical care time)  INCISION AND DRAINAGE Performed by: Achsah Mcquade Consent: Verbal consent obtained. Risks and benefits: risks, benefits and alternatives were discussed Type: abscess  Body area: Dental  Anesthesia: Topical infiltration  Incision was made with a #11 scalpel.  Local anesthetic: Hurricaine spray  Complexity: Simple   Drainage: purulent  Drainage amount: Moderate  Packing material: None  Patient tolerance: Patient tolerated the procedure well with no immediate complications.     Medications Ordered in ED Medications  Benzocaine (HURRCAINE) 20 % mouth spray (1 application Mouth/Throat Given 09/06/18 2112)  penicillin v potassium (VEETID) tablet 500 mg (500 mg Oral Given 09/06/18 2125)  HYDROcodone-acetaminophen (NORCO/VICODIN) 5-325 MG per tablet 1 tablet (1 tablet Oral Given 09/06/18 2125)     Initial Impression / Assessment and Plan / ED Course  I have reviewed the triage vital signs and the nursing notes.  Pertinent labs & imaging results that were available during my care of the patient were reviewed by me and considered in my medical decision making (see  chart for details).        Patient is well-appearing, nontoxic.  Mild to moderate left facial swelling with dental abscess.  Successful I&D.  No concerning symptoms for Ludewig's angina.  Patient reports feeling better after procedure.  She appears appropriate for discharge home, strict return precautions were discussed.  Final Clinical Impressions(s) / ED Diagnoses   Final diagnoses:  Dental abscess    ED Discharge Orders    None       Junice Fei,  Maceo Pro 09/09/18 1614    Donnetta Hutching, MD 09/10/18 1119

## 2018-09-06 NOTE — ED Triage Notes (Signed)
Pt reports that she has poor dental hygiene and has recurrent dental infections. Swelling to upper L jaw.

## 2018-09-06 NOTE — Discharge Instructions (Addendum)
Take the antibiotic as directed until its finished.  Warm salt water rinses.  Follow-up with your dentist soon.

## 2018-12-17 ENCOUNTER — Telehealth: Payer: Self-pay

## 2018-12-17 ENCOUNTER — Ambulatory Visit (INDEPENDENT_AMBULATORY_CARE_PROVIDER_SITE_OTHER)
Admission: RE | Admit: 2018-12-17 | Discharge: 2018-12-17 | Disposition: A | Discharge status: Home / Self Care | Payer: Self-pay | Source: Ambulatory Visit

## 2018-12-17 DIAGNOSIS — K047 Periapical abscess without sinus: Secondary | ICD-10-CM

## 2018-12-17 MED ORDER — PENICILLIN V POTASSIUM 500 MG PO TABS: 500.0000 mg | ORAL_TABLET | Freq: Four times a day (QID) | ORAL | 0 refills | Status: AC

## 2018-12-17 NOTE — Discharge Instructions (Signed)
Treating your for dental infection Take the meds as prescribed Dental resources given If your symptoms become more severe to include more facial swelling or trouble opening your mouth you need to go to the ER. Follow up as needed for continued or worsening symptoms

## 2018-12-17 NOTE — ED Provider Notes (Signed)
Virtual Visit via Video Note:  Rachel Hopkins  initiated request for Telemedicine visit with [facility] Urgent Care team. I connected with Rachel Hopkins  on 12/17/2018 at 3:07 PM  for a synchronized telemedicine visit using a video enabled HIPPA compliant telemedicine application. I verified that I am speaking with Rachel Hopkins  using two identifiers. Janace Aris, NP  was physically located in a [facility] Urgent care site and Rachel Hopkins was located at a different location.   The limitations of evaluation and management by telemedicine as well as the availability of in-person appointments were discussed. Patient was informed that she  may incur a bill ( including co-pay) for this virtual visit encounter. Rachel Hopkins  expressed understanding and gave verbal consent to proceed with virtual visit.     History of Present Illness:Rachel Hopkins  is a 43 y.o. female presents with right upper dental pain. This is a re current issue for her. This episode started a few days ago.She woke up this morning with right facial swelling.  She was treated for infection back in February. She has been unable to see a dentist due to cost.  She has been taking ibuprofen for the pain with some relief.  Denies any fevers or drainage from the mouth.  No trismus or trouble swallowing.  Past Medical History:  Diagnosis Date  . Asthma   . Hypertension     Allergies  Allergen Reactions  . 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 HEAD: normocephalic. Facial-obvious right-sided facial swelling Non labored breathing, no dyspnea or distress Skin: Skin normal color  PSYCHIATRIC: The mental examination revealed the patient was oriented to person, place, and time. The patient was able to demonstrate good judgement and reason, without hallucinations, abnormal affect  or abnormal behaviors during the examination. Patient is not suicidal     Assessment and Plan: Treating for dental infection with penicillin.  Continue the ibuprofen as needed for pain.  Instructed that if symptoms continue or severely worsen she will need to go to the ER.   Follow Up Instructions: Follow up as needed for continued or worsening symptoms     I discussed the assessment and treatment plan with the patient. The patient was provided an opportunity to ask questions and all were answered. The patient agreed with the plan and demonstrated an understanding of the instructions.   The patient was advised to call back or seek an in-person evaluation if the symptoms worsen or if the condition fails to improve as anticipated.  Janace Aris, NP  12/17/2018 3:07 PM         Janace Aris, NP 12/17/18 1514

## 2019-01-07 ENCOUNTER — Telehealth: Payer: Self-pay

## 2019-03-29 IMAGING — CT CT HEAD W/O CM
3 series · 15 of 46 positions shown, 18 images · non-contrast
Comparison: None.

CLINICAL DATA: Headache for 4 days

EXAM:
CT HEAD WITHOUT CONTRAST
TECHNIQUE: Contiguous axial images were obtained from the base of the skull
through the vertex without intravenous contrast.

[Series 2: head trauma wo · axial · 0.47mm/px · z∈[+30,+150]mm · 9 of 29 slices shown, 12 images]
[im 3/29  brain]
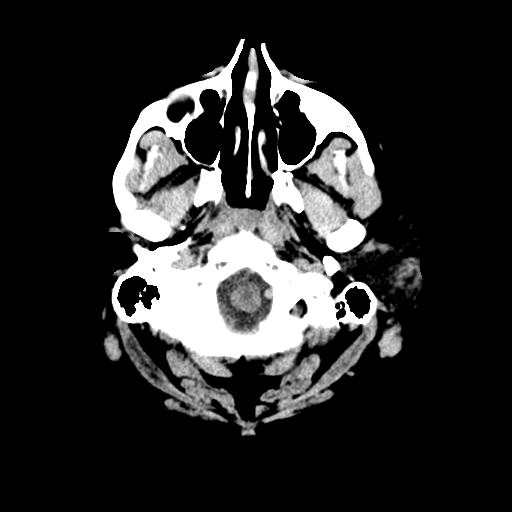
[im 3/29  bone]
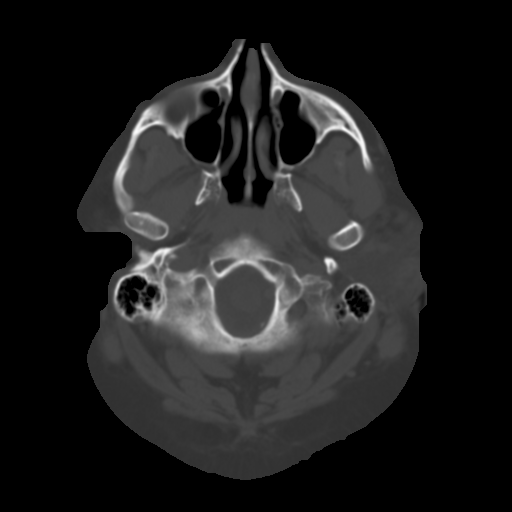
[im 6/29  brain]
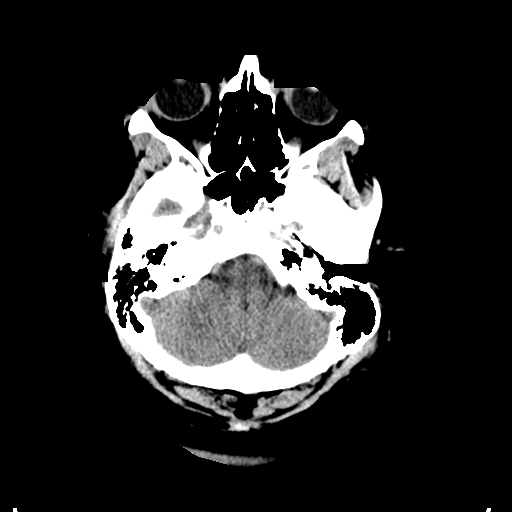
[im 9/29  brain]
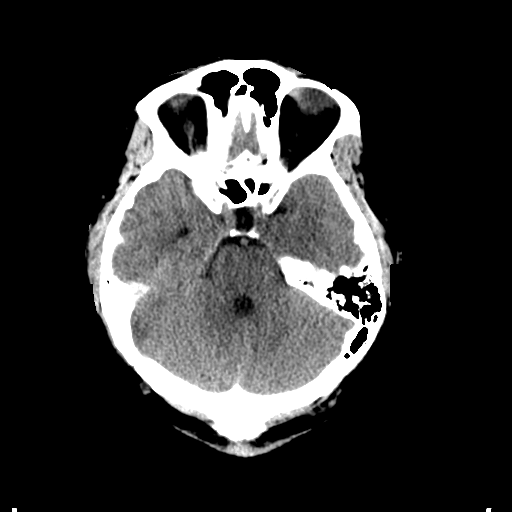
[im 12/29  brain]
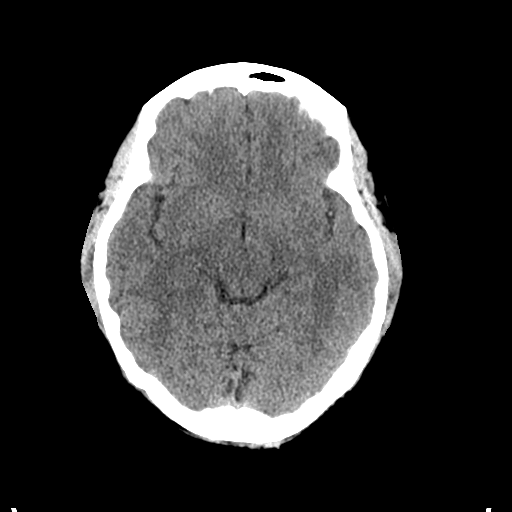
[im 15/29  brain]
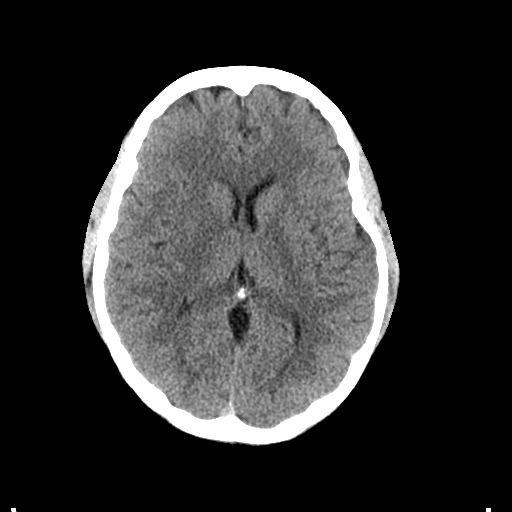
[im 15/29  bone]
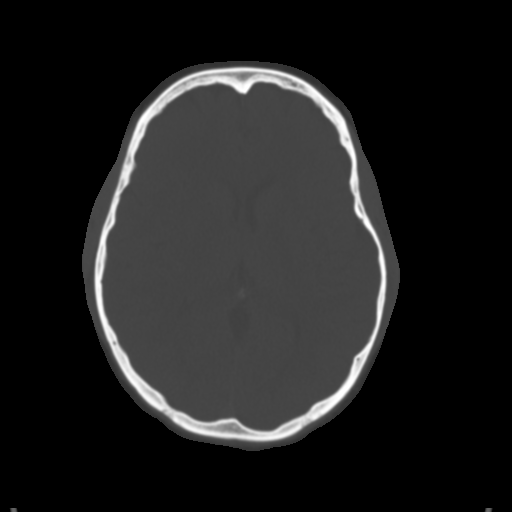
[im 18/29  brain]
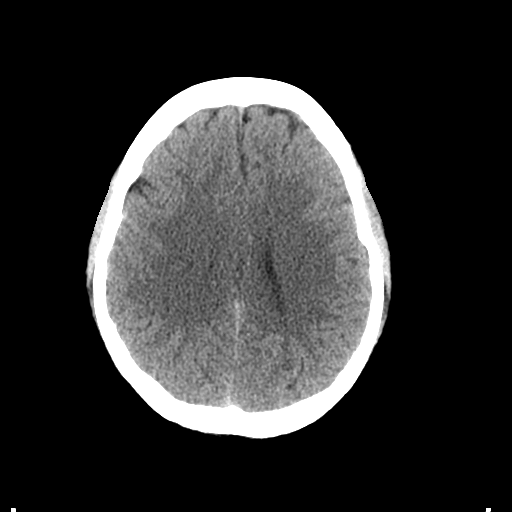
[im 21/29  brain]
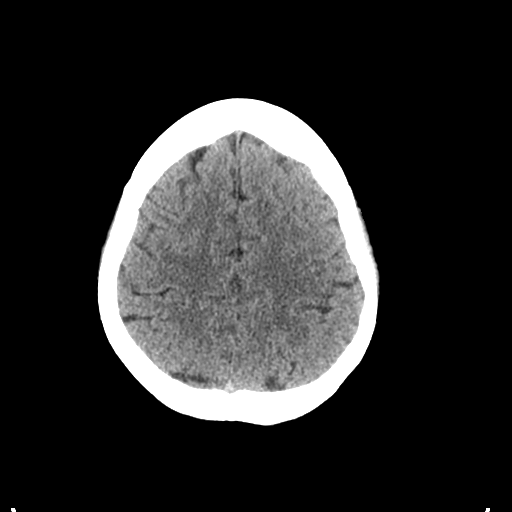
[im 24/29  brain]
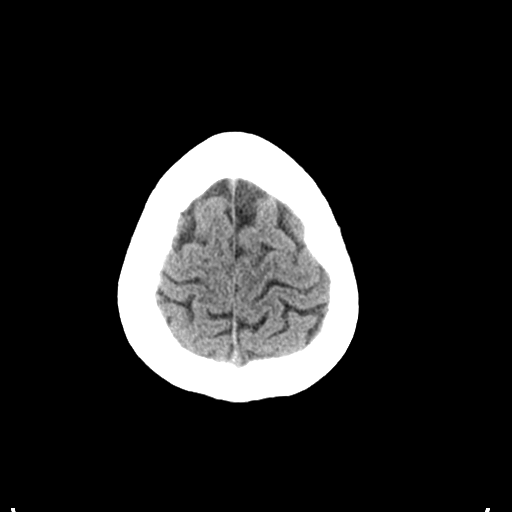
[im 27/29  brain]
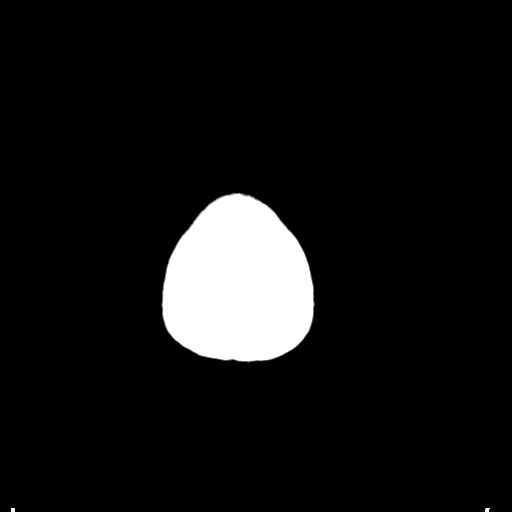
[im 27/29  bone]
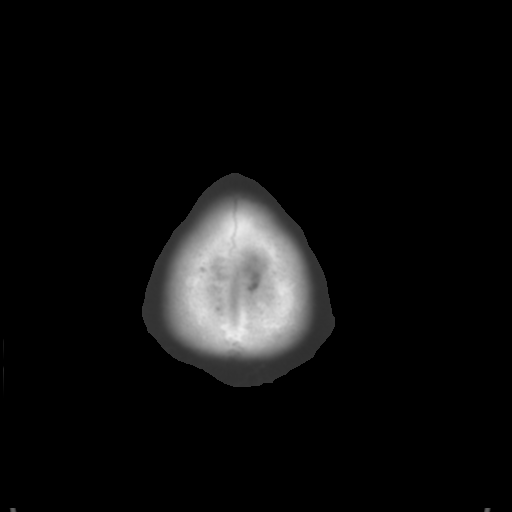

[Series 4: coronal soft tissue · coronal · 0.31mm/px · 3 of 70 slices shown]
[im 24/70  brain]
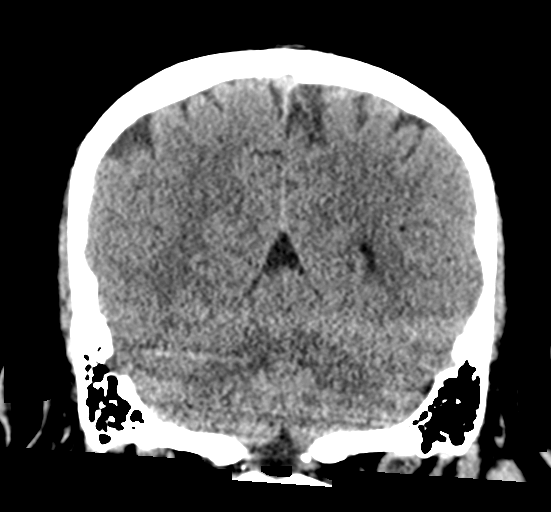
[im 31/70  brain]
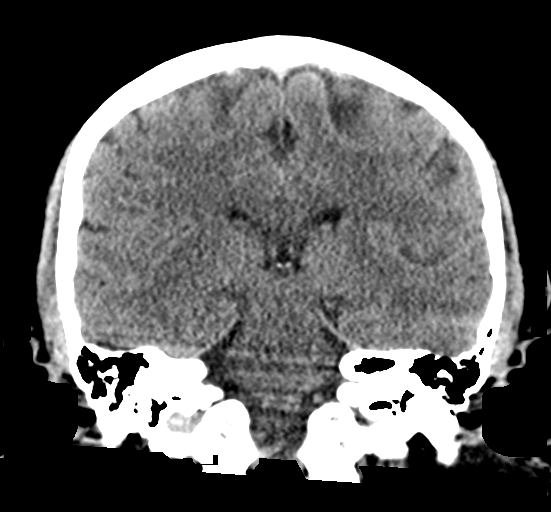
[im 39/70  brain]
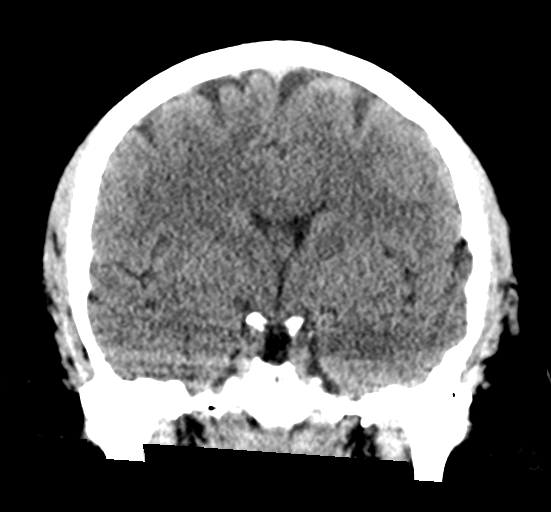

[Series 5: sagittal soft tissue · sagittal · 0.31mm/px · 3 of 62 slices shown]
[im 21/62  brain]
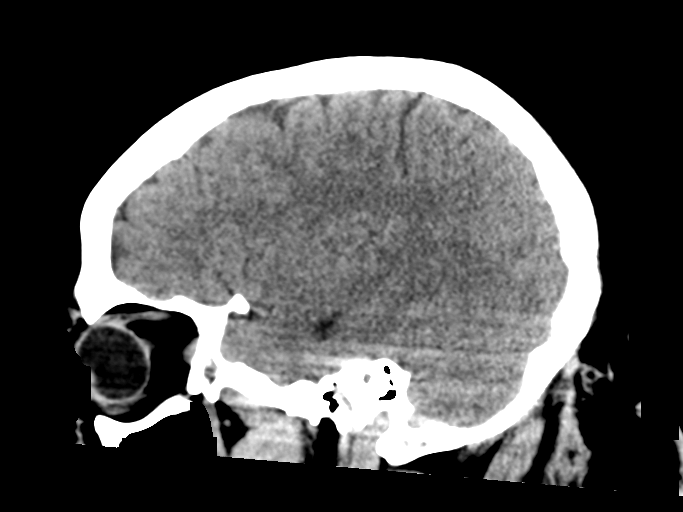
[im 31/62  brain]
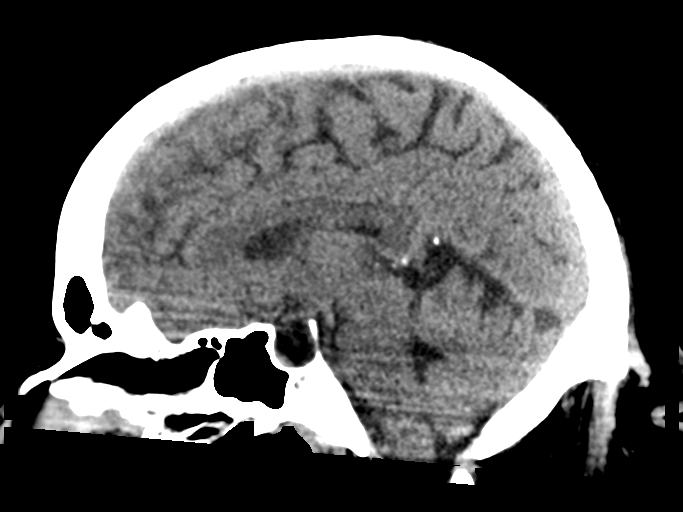
[im 41/62  brain]
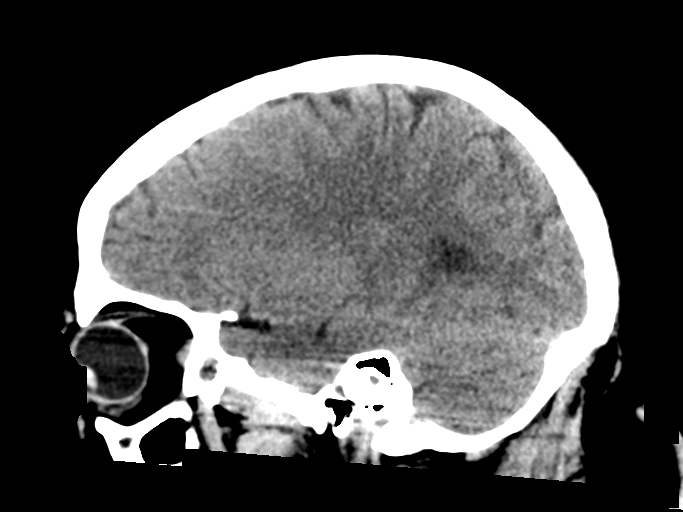

[15 of 46 positions shown; findings below may reference images not displayed]

FINDINGS: Brain: The ventricles are normal in size and configuration. There is
mild invagination of CSF into the sella. There is no intracranial
mass, hemorrhage, extra-axial fluid collection, or midline shift.
Brain parenchyma appears unremarkable. No evident acute infarct.

Vascular: No hyperdense vessel. There is slight calcification in the
right carotid siphon region.

Skull: The bony calvarium a appears intact.

Sinuses/Orbits: Visualized paranasal sinuses are clear. Orbits
appear symmetric bilaterally.

Other: Visualized mastoid air cells are clear.
IMPRESSION: Mild invagination of CSF into the sella, a finding of questionable
clinical significance. The ventricles appear normal. Brain
parenchyma appears normal. No mass or hemorrhage.

Slight arterial vascular calcification noted.

## 2019-03-30 IMAGING — US VENOUS DOPPLER ULTRASOUND OF BILATERAL LOWER EXTREMITIES
1 series · 13 of 24 positions shown · non-contrast
Comparison: None.

CLINICAL DATA: 42-year-old female with a history of pain



[Series 1: venous doppler ultrasound of bilateral lower extre · 0.10mm/px · 13 of 63 slices shown]
[im 1/63]
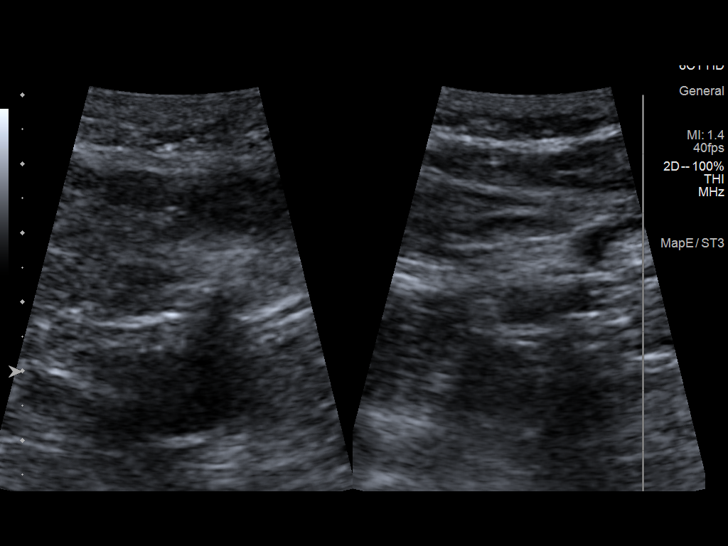
[im 6/63]
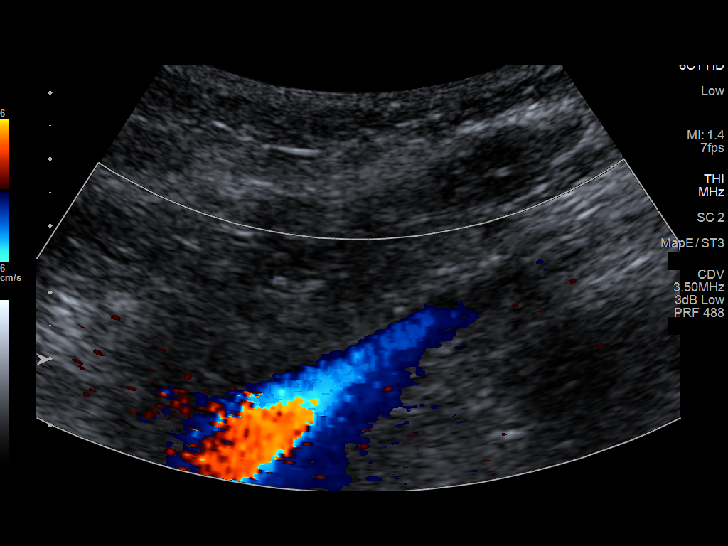
[im 11/63]
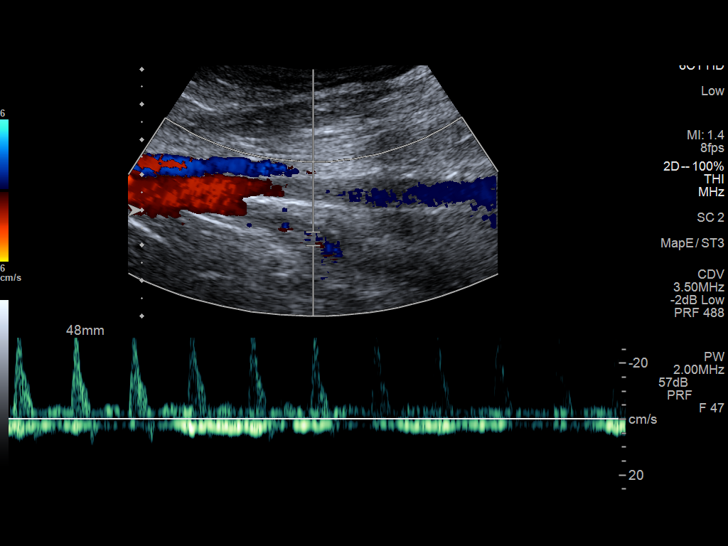
[im 17/63]
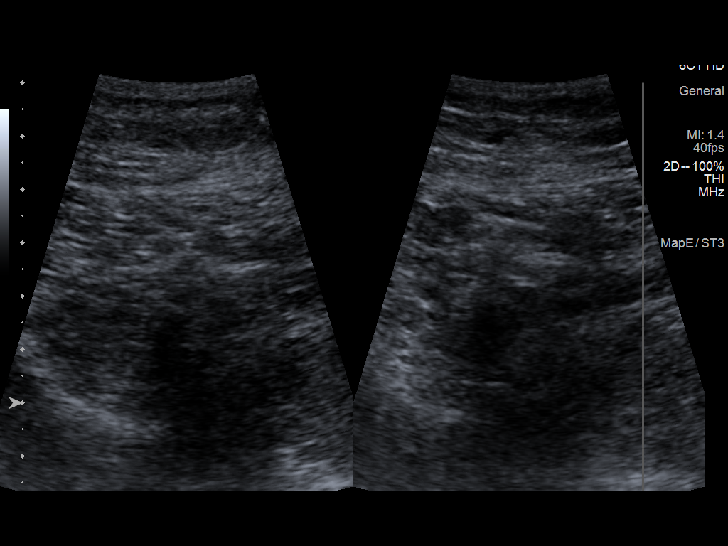
[im 22/63]
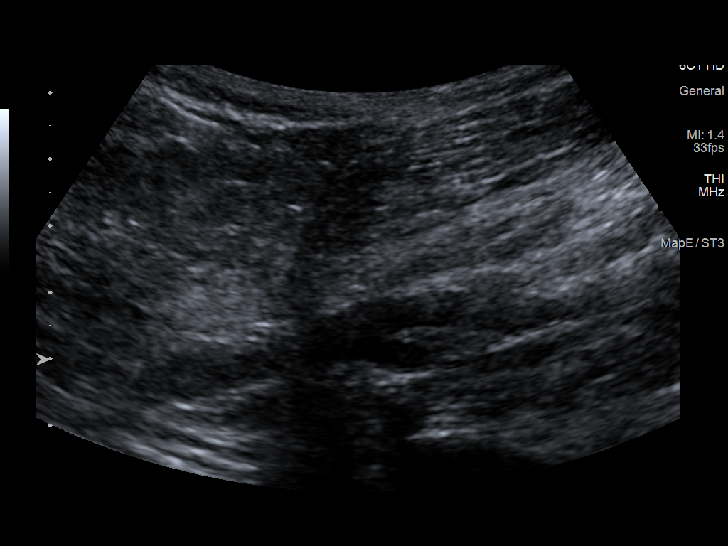
[im 27/63]
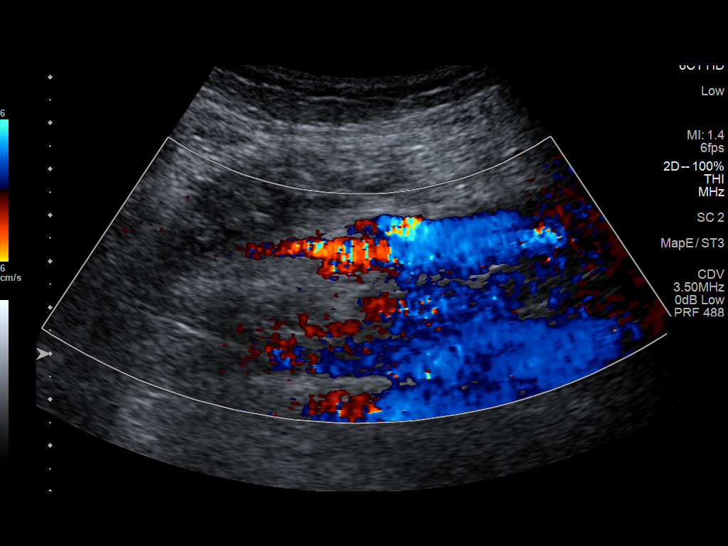
[im 33/63]
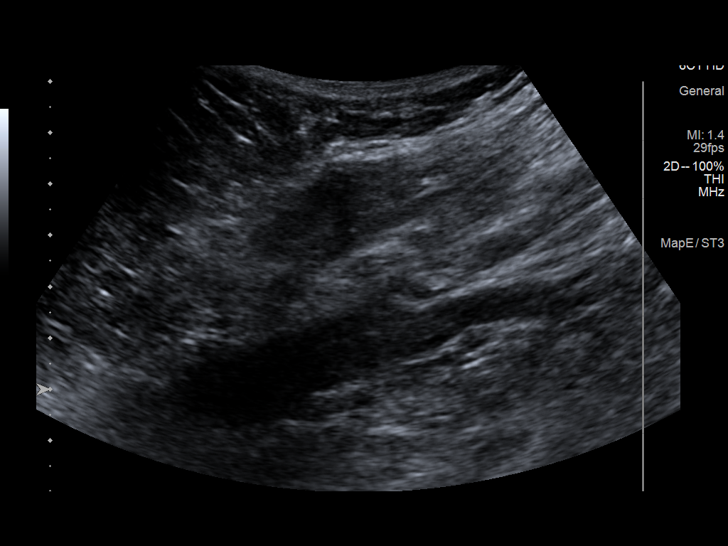
[im 36/63]
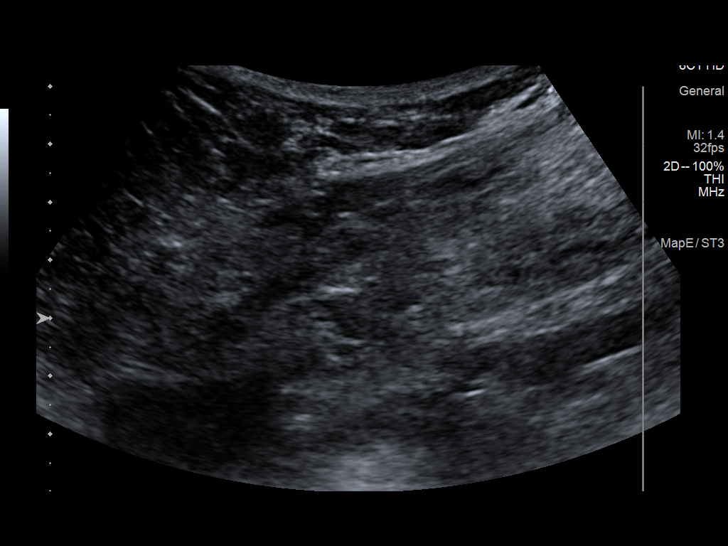
[im 41/63]
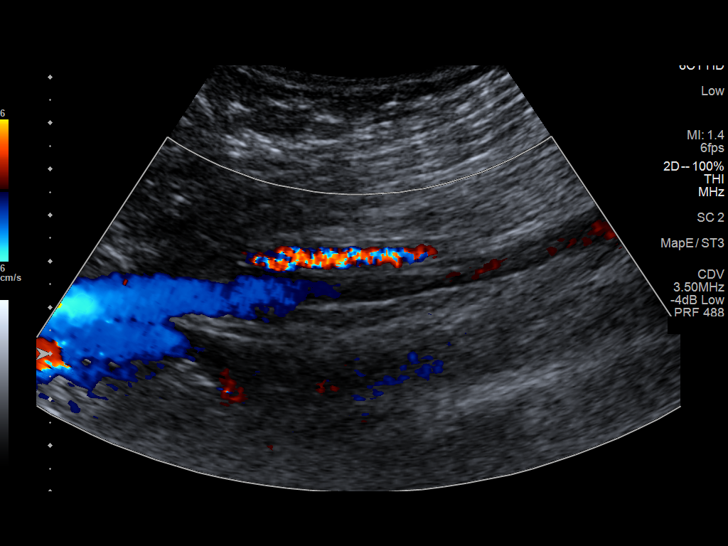
[im 46/63]
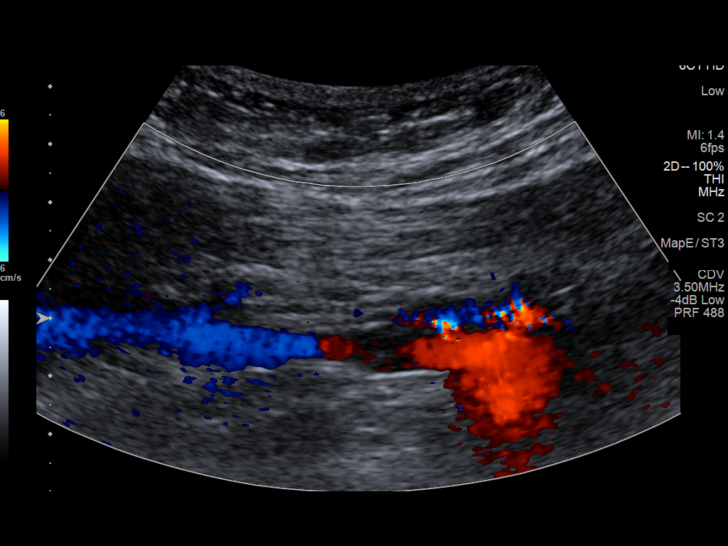
[im 52/63]
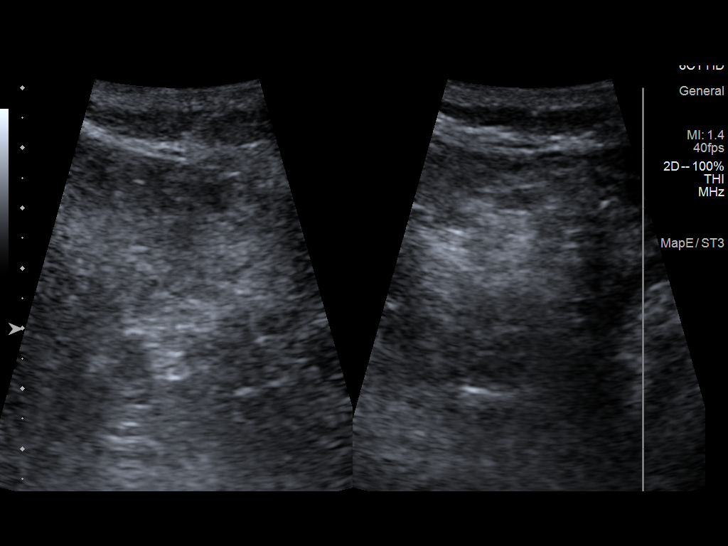
[im 57/63]
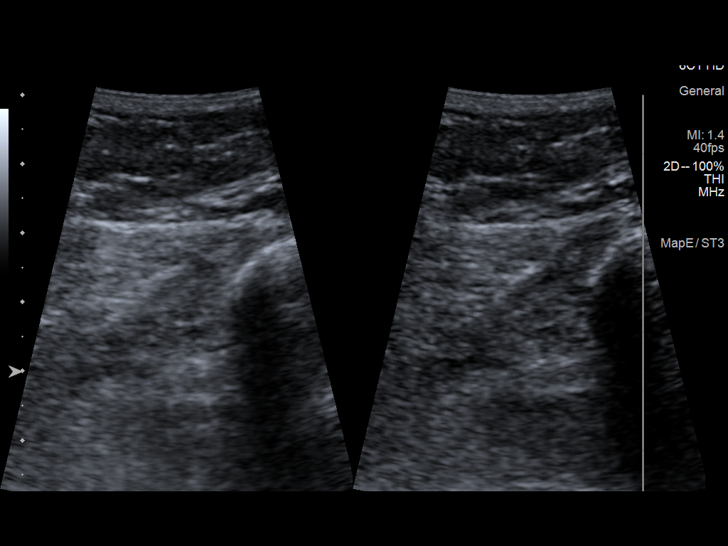
[im 63/63]
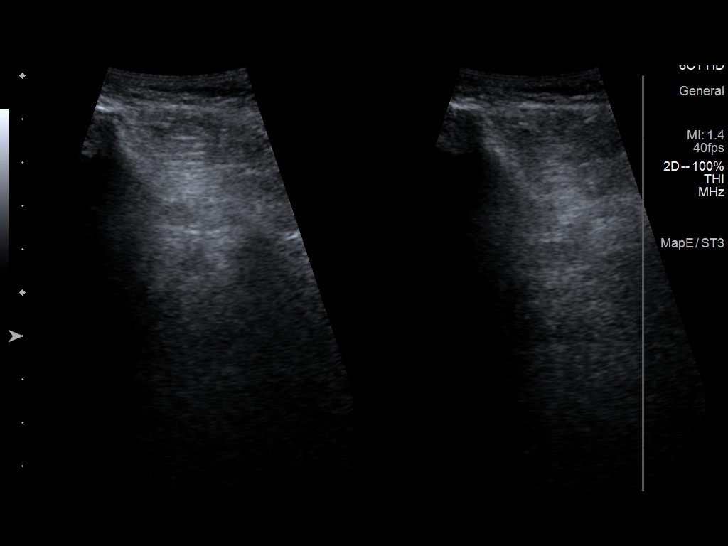

[13 of 24 positions shown; findings below may reference images not displayed]

FINDINGS: RIGHT LOWER EXTREMITY

Common Femoral Vein: No evidence of thrombus. Normal
compressibility, respiratory phasicity and response to augmentation.

Saphenofemoral Junction: No evidence of thrombus. Normal
compressibility and flow on color Doppler imaging.

Profunda Femoral Vein: No evidence of thrombus. Normal
compressibility and flow on color Doppler imaging.

Femoral Vein: No evidence of thrombus. Normal compressibility,
respiratory phasicity and response to augmentation.

Popliteal Vein: No evidence of thrombus. Normal compressibility,
respiratory phasicity and response to augmentation.

Calf Veins: No evidence of thrombus. Normal compressibility and flow
on color Doppler imaging.

Superficial Great Saphenous Vein: No evidence of thrombus. Normal
compressibility and flow on color Doppler imaging.

Other Findings:  None.

LEFT LOWER EXTREMITY

Common Femoral Vein: No evidence of thrombus. Normal
compressibility, respiratory phasicity and response to augmentation.

Saphenofemoral Junction: No evidence of thrombus. Normal
compressibility and flow on color Doppler imaging.

Profunda Femoral Vein: No evidence of thrombus. Normal
compressibility and flow on color Doppler imaging.

Femoral Vein: No evidence of thrombus. Normal compressibility,
respiratory phasicity and response to augmentation.

Popliteal Vein: No evidence of thrombus. Normal compressibility,
respiratory phasicity and response to augmentation.

Calf Veins: No evidence of thrombus. Normal compressibility and flow
on color Doppler imaging.

Superficial Great Saphenous Vein: No evidence of thrombus. Normal
compressibility and flow on color Doppler imaging.

Other Findings:  None.
IMPRESSION: Sonographic survey of the bilateral lower extremities negative for
DVT

## 2019-05-21 ENCOUNTER — Telehealth (INDEPENDENT_AMBULATORY_CARE_PROVIDER_SITE_OTHER): Payer: Self-pay

## 2019-05-22 ENCOUNTER — Other Ambulatory Visit: Payer: Self-pay

## 2019-05-22 DIAGNOSIS — Z20822 Contact with and (suspected) exposure to covid-19: Secondary | ICD-10-CM

## 2019-05-23 LAB — NOVEL CORONAVIRUS, NAA: SARS-CoV-2, NAA: NOT DETECTED

## 2019-10-28 ENCOUNTER — Ambulatory Visit (INDEPENDENT_AMBULATORY_CARE_PROVIDER_SITE_OTHER)
Admission: RE | Admit: 2019-10-28 | Discharge: 2019-10-28 | Disposition: A | Discharge status: Home / Self Care | Payer: Self-pay | Source: Ambulatory Visit

## 2019-10-28 DIAGNOSIS — K0889 Other specified disorders of teeth and supporting structures: Secondary | ICD-10-CM

## 2019-10-28 MED ORDER — PENICILLIN V POTASSIUM 500 MG PO TABS: 500.0000 mg | ORAL_TABLET | Freq: Four times a day (QID) | ORAL | 0 refills | Status: AC

## 2019-10-28 NOTE — ED Provider Notes (Signed)
Virtual Visit via Video Note:  Rachel Hopkins  initiated request for Telemedicine visit with [facility] Urgent Care team. I connected with Rachel Hopkins  on 10/28/2019 at 9:41 AM  for a synchronized telemedicine visit using a video enabled HIPPA compliant telemedicine application. I verified that I am speaking with Rachel Hopkins  using two identifiers. Mickie Bail, NP  was physically located in a [facility] Urgent care site and Rachel Hopkins was located at a different location.   The limitations of evaluation and management by telemedicine as well as the availability of in-person appointments were discussed. Patient was informed that she  may incur a bill ( including co-pay) for this virtual visit encounter. Rachel Hopkins  expressed understanding and gave verbal consent to proceed with virtual visit.     History of Present Illness:Rachel Hopkins  is a 44 y.o. female presents for evaluation of swollen painful right lower gum under a broken tooth x 1 day.  She denies fever, chills, difficulty swallowing, difficulty breathing, or other symptoms.  Patient states her teeth are in poor condition but she does not have a dentist.  Taking ibuprofen for pain with good relief.     Allergies  Allergen Reactions  . 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 Asthma   . Hypertension      Social History   Tobacco Use  . Smoking status: Current Every Day Smoker    Packs/day: 0.50    Types: Cigarettes  . Smokeless tobacco: Never Used  Substance Use Topics  . Alcohol use: Yes    Comment: occasionally  . Drug use: Yes    Types: Marijuana    ROS: as stated in HPI.  All other systems reviewed and negative.     Observations/Objective: Physical Exam  VITALS: Patient denies fever. GENERAL: Alert, appears well and in no acute distress. HEENT: Atraumatic.  Speech clear.  No difficulty swallowing.  NECK: Normal movements of the head and  neck. CARDIOPULMONARY: No increased WOB. Speaking in clear sentences. I:E ratio WNL.  MS: Moves all visible extremities without noticeable abnormality. PSYCH: Pleasant and cooperative, well-groomed. Speech normal rate and rhythm. Affect is appropriate. Insight and judgement are appropriate. Attention is focused, linear, and appropriate.  NEURO: CN grossly intact. Oriented as arrived to appointment on time with no prompting. Moves both UE equally.  SKIN: No obvious lesions, wounds, erythema, or cyanosis noted on face or hands.   Assessment and Plan:    ICD-10-CM   1. Toothache  K08.89        Follow Up Instructions: Treating with Pen VK.  Continue ibuprofen for discomfort.  Instructed patient to follow up with a dentist as soon as she is able.  Patient agrees to plan of care.      I discussed the assessment and treatment plan with the patient. The patient was provided an opportunity to ask questions and all were answered. The patient agreed with the plan and demonstrated an understanding of the instructions.   The patient was advised to call back or seek an in-person evaluation if the symptoms worsen or if the condition fails to improve as anticipated.      Mickie Bail, NP  10/28/2019 9:41 AM         Mickie Bail, NP 10/28/19 [phone]

## 2019-10-28 NOTE — Discharge Instructions (Addendum)
Take the antibiotic as prescribed.    A dental resource guide is attached.  Please call to make an appointment with a dentist as soon as possible.      

## 2020-06-07 ENCOUNTER — Telehealth: Payer: Self-pay | Admitting: Physician Assistant

## 2020-06-07 DIAGNOSIS — K0889 Other specified disorders of teeth and supporting structures: Secondary | ICD-10-CM

## 2020-06-07 MED ORDER — AMOXICILLIN 500 MG PO CAPS: 500.0000 mg | ORAL_CAPSULE | Freq: Three times a day (TID) | ORAL | 0 refills | Status: AC

## 2020-06-07 NOTE — Progress Notes (Signed)
E-Visit for Dental Pain  We are sorry that you are not feeling well.  Here is how we plan to help!  Based on what you have shared with me in the questionnaire, it sounds like you have a dental infection.  If you are also having pain in your throat with swallowing you will need a face-to-face evaluation TODAY.    Amoxicillin 500mg  3 times per day for 10 days  It is imperative that you see a dentist within 10 days of this eVisit to determine the cause of the dental pain and be sure it is adequately treated  A toothache or tooth pain is caused when the nerve in the root of a tooth or surrounding a tooth is irritated. Dental (tooth) infection, decay, injury, or loss of a tooth are the most common causes of dental pain. Pain may also occur after an extraction (tooth is pulled out). Pain sometimes originates from other areas and radiates to the jaw, thus appearing to be tooth pain.Bacteria growing inside your mouth can contribute to gum disease and dental decay, both of which can cause pain. A toothache occurs from inflammation of the central portion of the tooth called pulp. The pulp contains nerve endings that are very sensitive to pain. Inflammation to the pulp or pulpitis may be caused by dental cavities, trauma, and infection.    HOME CARE:   For toothaches: . Over-the-counter pain medications such as acetaminophen or ibuprofen may be used. Take these as directed on the package while you arrange for a dental appointment. . Avoid very cold or hot foods, because they may make the pain worse. . You may get relief from biting on a cotton ball soaked in oil of cloves. You can get oil of cloves at most drug stores.  For jaw pain: .  Aspirin may be helpful for problems in the joint of the jaw in adults. . If pain happens every time you open your mouth widely, the temporomandibular joint (TMJ) may be the source of the pain. Yawning or taking a large bite of food may worsen the pain. An appointment with  your doctor or dentist will help you find the cause.     GET HELP RIGHT AWAY IF:  . You have a high fever or chills . If you have had a recent head or face injury and develop headache, light headedness, nausea, vomiting, or other symptoms that concern you after an injury to your face or mouth, you could have a more serious injury in addition to your dental injury. . A facial rash associated with a toothache: This condition may improve with medication. Contact your doctor for them to decide what is appropriate. . Any jaw pain occurring with chest pain: Although jaw pain is most commonly caused by dental disease, it is sometimes referred pain from other areas. People with heart disease, especially people who have had stents placed, people with diabetes, or those who have had heart surgery may have jaw pain as a symptom of heart attack or angina. If your jaw or tooth pain is associated with lightheadedness, sweating, or shortness of breath, you should see a doctor as soon as possible. . Trouble swallowing or excessive pain or bleeding from gums: If you have a history of a weakened immune system, diabetes, or steroid use, you may be more susceptible to infections. Infections can often be more severe and extensive or caused by unusual organisms. Dental and gum infections in people with these conditions may require more aggressive  treatment. An abscess may need draining or IV antibiotics, for example.  MAKE SURE YOU    Understand these instructions.  Will watch your condition.  Will get help right away if you are not doing well or get worse.  Thank you for choosing an e-visit. Your e-visit answers were reviewed by a board certified advanced clinical practitioner to complete your personal care plan. Depending upon the condition, your plan could have included both over the counter or prescription medications. Please review your pharmacy choice. Make sure the pharmacy is open so you can pick up  prescription now. If there is a problem, you may contact your provider through [employer] and have the prescription routed to another pharmacy. Your safety is important to [place]. If you have drug allergies check your prescription carefully.  For the next 24 hours you can use MyChart to ask questions about today's visit, request a non-urgent call back, or ask for a work or school excuse. You will get an email in the next two days asking about your experience. I hope that your e-visit has been valuable and will speed your recovery.  Greater than 5 minutes, yet less than 10 minutes of time have been spent researching, coordinating, and implementing care for this patient today

## 2020-10-06 ENCOUNTER — Telehealth: Payer: Self-pay | Admitting: Emergency Medicine

## 2020-10-06 DIAGNOSIS — K0889 Other specified disorders of teeth and supporting structures: Secondary | ICD-10-CM

## 2020-10-06 MED ORDER — AMOXICILLIN 500 MG PO CAPS: 500.0000 mg | ORAL_CAPSULE | Freq: Three times a day (TID) | ORAL | 0 refills | Status: DC

## 2020-10-06 NOTE — Progress Notes (Signed)
E-Visit for Dental Pain  We are sorry that you are not feeling well.  Here is how we plan to help!  Based on what you have shared with me in the questionnaire, it sounds like you have an early dental infection, causing a mouth sore.  You may use warm saltwater rinses to help with the inflammation and mouth sore.  I am also prescribing an antibiotic,  Amoxicillin 500mg  3 times per day for 10 days  It is imperative that you see a dentist within 10 days of this eVisit to determine the cause of the dental pain and be sure it is adequately treated  A toothache or tooth pain is caused when the nerve in the root of a tooth or surrounding a tooth is irritated. Dental (tooth) infection, decay, injury, or loss of a tooth are the most common causes of dental pain. Pain may also occur after an extraction (tooth is pulled out). Pain sometimes originates from other areas and radiates to the jaw, thus appearing to be tooth pain.Bacteria growing inside your mouth can contribute to gum disease and dental decay, both of which can cause pain. A toothache occurs from inflammation of the central portion of the tooth called pulp. The pulp contains nerve endings that are very sensitive to pain. Inflammation to the pulp or pulpitis may be caused by dental cavities, trauma, and infection.    HOME CARE:   For toothaches: . Over-the-counter pain medications such as acetaminophen or ibuprofen may be used. Take these as directed on the package while you arrange for a dental appointment. . Avoid very cold or hot foods, because they may make the pain worse. . You may get relief from biting on a cotton ball soaked in oil of cloves. You can get oil of cloves at most drug stores.  For jaw pain: .  Aspirin may be helpful for problems in the joint of the jaw in adults. . If pain happens every time you open your mouth widely, the temporomandibular joint (TMJ) may be the source of the pain. Yawning or taking a large bite of food  may worsen the pain. An appointment with your doctor or dentist will help you find the cause.     GET HELP RIGHT AWAY IF:  . You have a high fever or chills . If you have had a recent head or face injury and develop headache, light headedness, nausea, vomiting, or other symptoms that concern you after an injury to your face or mouth, you could have a more serious injury in addition to your dental injury. . A facial rash associated with a toothache: This condition may improve with medication. Contact your doctor for them to decide what is appropriate. . Any jaw pain occurring with chest pain: Although jaw pain is most commonly caused by dental disease, it is sometimes referred pain from other areas. People with heart disease, especially people who have had stents placed, people with diabetes, or those who have had heart surgery may have jaw pain as a symptom of heart attack or angina. If your jaw or tooth pain is associated with lightheadedness, sweating, or shortness of breath, you should see a doctor as soon as possible. . Trouble swallowing or excessive pain or bleeding from gums: If you have a history of a weakened immune system, diabetes, or steroid use, you may be more susceptible to infections. Infections can often be more severe and extensive or caused by unusual organisms. Dental and gum infections in people with  these conditions may require more aggressive treatment. An abscess may need draining or IV antibiotics, for example.  MAKE SURE YOU    Understand these instructions.  Will watch your condition.  Will get help right away if you are not doing well or get worse.  Thank you for choosing an e-visit. Your e-visit answers were reviewed by a board certified advanced clinical practitioner to complete your personal care plan. Depending upon the condition, your plan could have included both over the counter or prescription medications. Please review your pharmacy choice. Make sure the  pharmacy is open so you can pick up prescription now. If there is a problem, you may contact your provider through [employer] and have the prescription routed to another pharmacy. Your safety is important to [place]. If you have drug allergies check your prescription carefully.  For the next 24 hours you can use MyChart to ask questions about today's visit, request a non-urgent call back, or ask for a work or school excuse. You will get an email in the next two days asking about your experience. I hope that your e-visit has been valuable and will speed your recovery.  Approximately 5 minutes was spent documenting and reviewing patient's chart.

## 2020-12-05 ENCOUNTER — Telehealth: Payer: Self-pay | Admitting: Physician Assistant

## 2020-12-05 DIAGNOSIS — K047 Periapical abscess without sinus: Secondary | ICD-10-CM

## 2020-12-05 DIAGNOSIS — K0889 Other specified disorders of teeth and supporting structures: Secondary | ICD-10-CM

## 2020-12-05 MED ORDER — PENICILLIN V POTASSIUM 500 MG PO TABS: 500.0000 mg | ORAL_TABLET | Freq: Three times a day (TID) | ORAL | 0 refills | Status: AC

## 2020-12-05 MED ORDER — IBUPROFEN 600 MG PO TABS: 600.0000 mg | ORAL_TABLET | Freq: Three times a day (TID) | ORAL | 0 refills | Status: DC | PRN

## 2020-12-05 NOTE — Progress Notes (Signed)
E-Visit for Dental Pain  We are sorry that you are not feeling well.  Here is how we plan to help!  Based on what you have shared with me in the questionnaire, it sounds like you have a broken tooth and dental infection.  Ibuprofen 600mg  3 times a day for 7 days for discomfort and PCN 500mg  3 times per day for 10 days  It is imperative that you see a dentist within 10 days of this eVisit to determine the cause of the dental pain and be sure it is adequately treated  A toothache or tooth pain is caused when the nerve in the root of a tooth or surrounding a tooth is irritated. Dental (tooth) infection, decay, injury, or loss of a tooth are the most common causes of dental pain. Pain may also occur after an extraction (tooth is pulled out). Pain sometimes originates from other areas and radiates to the jaw, thus appearing to be tooth pain.Bacteria growing inside your mouth can contribute to gum disease and dental decay, both of which can cause pain. A toothache occurs from inflammation of the central portion of the tooth called pulp. The pulp contains nerve endings that are very sensitive to pain. Inflammation to the pulp or pulpitis may be caused by dental cavities, trauma, and infection.    HOME CARE:   For toothaches: . Over-the-counter pain medications such as acetaminophen or ibuprofen may be used. Take these as directed on the package while you arrange for a dental appointment. . Avoid very cold or hot foods, because they may make the pain worse. . You may get relief from biting on a cotton ball soaked in oil of cloves. You can get oil of cloves at most drug stores.  For jaw pain: .  Aspirin may be helpful for problems in the joint of the jaw in adults. . If pain happens every time you open your mouth widely, the temporomandibular joint (TMJ) may be the source of the pain. Yawning or taking a large bite of food may worsen the pain. An appointment with your doctor or dentist will help you  find the cause.     GET HELP RIGHT AWAY IF:  . You have a high fever or chills . If you have had a recent head or face injury and develop headache, light headedness, nausea, vomiting, or other symptoms that concern you after an injury to your face or mouth, you could have a more serious injury in addition to your dental injury. . A facial rash associated with a toothache: This condition may improve with medication. Contact your doctor for them to decide what is appropriate. . Any jaw pain occurring with chest pain: Although jaw pain is most commonly caused by dental disease, it is sometimes referred pain from other areas. People with heart disease, especially people who have had stents placed, people with diabetes, or those who have had heart surgery may have jaw pain as a symptom of heart attack or angina. If your jaw or tooth pain is associated with lightheadedness, sweating, or shortness of breath, you should see a doctor as soon as possible. . Trouble swallowing or excessive pain or bleeding from gums: If you have a history of a weakened immune system, diabetes, or steroid use, you may be more susceptible to infections. Infections can often be more severe and extensive or caused by unusual organisms. Dental and gum infections in people with these conditions may require more aggressive treatment. An abscess may need draining  or IV antibiotics, for example.  MAKE SURE YOU    Understand these instructions.  Will watch your condition.  Will get help right away if you are not doing well or get worse.  Thank you for choosing an e-visit. Your e-visit answers were reviewed by a board certified advanced clinical practitioner to complete your personal care plan. Depending upon the condition, your plan could have included both over the counter or prescription medications. Please review your pharmacy choice. Make sure the pharmacy is open so you can pick up prescription now. If there is a problem, you  may contact your provider through [employer] and have the prescription routed to another pharmacy. Your safety is important to [place]. If you have drug allergies check your prescription carefully.  For the next 24 hours you can use MyChart to ask questions about today's visit, request a non-urgent call back, or ask for a work or school excuse. You will get an email in the next two days asking about your experience. I hope that your e-visit has been valuable and will speed your recovery.   Greater than 5 minutes, yet less than 10 minutes of time have been spent researching, coordinating, and implementing care for this patient today

## 2021-02-22 ENCOUNTER — Other Ambulatory Visit (HOSPITAL_COMMUNITY): Payer: Self-pay | Admitting: Nurse Practitioner

## 2021-02-22 DIAGNOSIS — Z1231 Encounter for screening mammogram for malignant neoplasm of breast: Secondary | ICD-10-CM

## 2021-03-02 ENCOUNTER — Other Ambulatory Visit (HOSPITAL_COMMUNITY): Payer: Self-pay | Admitting: Family Medicine

## 2021-03-06 ENCOUNTER — Ambulatory Visit (HOSPITAL_COMMUNITY): Payer: Self-pay

## 2021-03-13 ENCOUNTER — Ambulatory Visit (HOSPITAL_COMMUNITY): Payer: Self-pay

## 2021-03-29 ENCOUNTER — Telehealth: Payer: Self-pay | Admitting: Emergency Medicine

## 2021-03-29 DIAGNOSIS — K0889 Other specified disorders of teeth and supporting structures: Secondary | ICD-10-CM

## 2021-03-29 MED ORDER — PENICILLIN V POTASSIUM 500 MG PO TABS: 500.0000 mg | ORAL_TABLET | Freq: Four times a day (QID) | ORAL | 0 refills | Status: DC

## 2021-03-29 MED ORDER — IBUPROFEN 800 MG PO TABS: 800.0000 mg | ORAL_TABLET | Freq: Three times a day (TID) | ORAL | 0 refills | Status: DC

## 2021-03-29 NOTE — Progress Notes (Signed)
E-Visit for Dental Pain  We are sorry that you are not feeling well.  Here is how we plan to help!  Based on what you have shared with me in the questionnaire, it sounds like you have pain due to a broken tooth.  To help with preventing infection, I've prescribed Penicillin.  To help with pain, I've prescribed Ibuprofen 800mg .    You will need to follow-up with a dentist to have your tooth fixed.    It is imperative that you see a dentist within 10 days of this eVisit to determine the cause of the dental pain and be sure it is adequately treated  A toothache or tooth pain is caused when the nerve in the root of a tooth or surrounding a tooth is irritated. Dental (tooth) infection, decay, injury, or loss of a tooth are the most common causes of dental pain. Pain may also occur after an extraction (tooth is pulled out). Pain sometimes originates from other areas and radiates to the jaw, thus appearing to be tooth pain.Bacteria growing inside your mouth can contribute to gum disease and dental decay, both of which can cause pain. A toothache occurs from inflammation of the central portion of the tooth called pulp. The pulp contains nerve endings that are very sensitive to pain. Inflammation to the pulp or pulpitis may be caused by dental cavities, trauma, and infection.    HOME CARE:   For toothaches: Over-the-counter pain medications such as acetaminophen or ibuprofen may be used. Take these as directed on the package while you arrange for a dental appointment. Avoid very cold or hot foods, because they may make the pain worse. You may get relief from biting on a cotton ball soaked in oil of cloves. You can get oil of cloves at most drug stores.  For jaw pain:  Aspirin may be helpful for problems in the joint of the jaw in adults. If pain happens every time you open your mouth widely, the temporomandibular joint (TMJ) may be the source of the pain. Yawning or taking a large bite of food may  worsen the pain. An appointment with your doctor or dentist will help you find the cause.     GET HELP RIGHT AWAY IF:  You have a high fever or chills If you have had a recent head or face injury and develop headache, light headedness, nausea, vomiting, or other symptoms that concern you after an injury to your face or mouth, you could have a more serious injury in addition to your dental injury. A facial rash associated with a toothache: This condition may improve with medication. Contact your doctor for them to decide what is appropriate. Any jaw pain occurring with chest pain: Although jaw pain is most commonly caused by dental disease, it is sometimes referred pain from other areas. People with heart disease, especially people who have had stents placed, people with diabetes, or those who have had heart surgery may have jaw pain as a symptom of heart attack or angina. If your jaw or tooth pain is associated with lightheadedness, sweating, or shortness of breath, you should see a doctor as soon as possible. Trouble swallowing or excessive pain or bleeding from gums: If you have a history of a weakened immune system, diabetes, or steroid use, you may be more susceptible to infections. Infections can often be more severe and extensive or caused by unusual organisms. Dental and gum infections in people with these conditions may require more aggressive treatment. An  abscess may need draining or IV antibiotics, for example.  MAKE SURE YOU   Understand these instructions. Will watch your condition. Will get help right away if you are not doing well or get worse.  Thank you for choosing an e-visit.  Your e-visit answers were reviewed by a board certified advanced clinical practitioner to complete your personal care plan. Depending upon the condition, your plan could have included both over the counter or prescription medications.  Please review your pharmacy choice. Make sure the pharmacy is open  so you can pick up prescription now. If there is a problem, you may contact your provider through [employer] and have the prescription routed to another pharmacy.  Your safety is important to [place]. If you have drug allergies check your prescription carefully.   For the next 24 hours you can use MyChart to ask questions about today's visit, request a non-urgent call back, or ask for a work or school excuse. You will get an email in the next two days asking about your experience. I hope that your e-visit has been valuable and will speed your recovery.  Approximately 5 minutes was used in reviewing the patient's chart, questionnaire, prescribing medications, and documentation.

## 2021-05-04 ENCOUNTER — Ambulatory Visit (HOSPITAL_COMMUNITY): Payer: Self-pay

## 2021-06-18 ENCOUNTER — Telehealth: Payer: Self-pay | Admitting: Nurse Practitioner

## 2021-06-18 DIAGNOSIS — K0889 Other specified disorders of teeth and supporting structures: Secondary | ICD-10-CM

## 2021-06-18 MED ORDER — IBUPROFEN 600 MG PO TABS: 600.0000 mg | ORAL_TABLET | Freq: Three times a day (TID) | ORAL | 0 refills | Status: DC | PRN

## 2021-06-18 MED ORDER — CLINDAMYCIN HCL 300 MG PO CAPS: 300.0000 mg | ORAL_CAPSULE | Freq: Four times a day (QID) | ORAL | 0 refills | Status: AC

## 2021-06-18 NOTE — Progress Notes (Signed)
E-Visit for Dental Pain  We are sorry that you are not feeling well.  Here is how we plan to help!  Based on what you have shared with me in the questionnaire, it sounds like you have dental pain and possible dental abscess due to a broken tooth.   Ibuprofen 600mg  3 times a day for 7 days for discomfort and Clindamycin 300mg  4 times per day for days  It is imperative that you see a dentist within 10 days of this eVisit to determine the cause of the dental pain and be sure it is adequately treated. It is not safe to continue prescribing antibiotics this frequently for these symptoms.   A toothache or tooth pain is caused when the nerve in the root of a tooth or surrounding a tooth is irritated. Dental (tooth) infection, decay, injury, or loss of a tooth are the most common causes of dental pain. Pain may also occur after an extraction (tooth is pulled out). Pain sometimes originates from other areas and radiates to the jaw, thus appearing to be tooth pain.Bacteria growing inside your mouth can contribute to gum disease and dental decay, both of which can cause pain. A toothache occurs from inflammation of the central portion of the tooth called pulp. The pulp contains nerve endings that are very sensitive to pain. Inflammation to the pulp or pulpitis may be caused by dental cavities, trauma, and infection.    HOME CARE:   For toothaches: Over-the-counter pain medications such as acetaminophen or ibuprofen may be used. Take these as directed on the package while you arrange for a dental appointment. Avoid very cold or hot foods, because they may make the pain worse. You may get relief from biting on a cotton ball soaked in oil of cloves. You can get oil of cloves at most drug stores.  For jaw pain:  Aspirin may be helpful for problems in the joint of the jaw in adults. If pain happens every time you open your mouth widely, the temporomandibular joint (TMJ) may be the source of the pain. Yawning  or taking a large bite of food may worsen the pain. An appointment with your doctor or dentist will help you find the cause.     GET HELP RIGHT AWAY IF:  You have a high fever or chills If you have had a recent head or face injury and develop headache, light headedness, nausea, vomiting, or other symptoms that concern you after an injury to your face or mouth, you could have a more serious injury in addition to your dental injury. A facial rash associated with a toothache: This condition may improve with medication. Contact your doctor for them to decide what is appropriate. Any jaw pain occurring with chest pain: Although jaw pain is most commonly caused by dental disease, it is sometimes referred pain from other areas. People with heart disease, especially people who have had stents placed, people with diabetes, or those who have had heart surgery may have jaw pain as a symptom of heart attack or angina. If your jaw or tooth pain is associated with lightheadedness, sweating, or shortness of breath, you should see a doctor as soon as possible. Trouble swallowing or excessive pain or bleeding from gums: If you have a history of a weakened immune system, diabetes, or steroid use, you may be more susceptible to infections. Infections can often be more severe and extensive or caused by unusual organisms. Dental and gum infections in people with these conditions may  require more aggressive treatment. An abscess may need draining or IV antibiotics, for example.  MAKE SURE YOU   Understand these instructions. Will watch your condition. Will get help right away if you are not doing well or get worse.  Thank you for choosing an e-visit.  Your e-visit answers were reviewed by a board certified advanced clinical practitioner to complete your personal care plan. Depending upon the condition, your plan could have included both over the counter or prescription medications.  Please review your pharmacy  choice. Make sure the pharmacy is open so you can pick up prescription now. If there is a problem, you may contact your provider through [employer] and have the prescription routed to another pharmacy.  Your safety is important to [place]. If you have drug allergies check your prescription carefully.   For the next 24 hours you can use MyChart to ask questions about today's visit, request a non-urgent call back, or ask for a work or school excuse. You will get an email in the next two days asking about your experience. I hope that your e-visit has been valuable and will speed your recovery.

## 2021-06-18 NOTE — Progress Notes (Signed)
I have spent at least 5 minutes reviewing and documenting in the patient's chart.  

## 2021-08-05 ENCOUNTER — Telehealth: Payer: Self-pay | Admitting: Nurse Practitioner

## 2021-08-05 DIAGNOSIS — K0889 Other specified disorders of teeth and supporting structures: Secondary | ICD-10-CM

## 2021-08-05 NOTE — Progress Notes (Signed)
Based on what you shared with me it looks like you have dental pain,that should be evaluated in a face to face office visit. You will need to have a face to face visit for treatment. You have been traeted 6 times for this in the last 2 months. You were told that you needed to see a dentist within 10 days of your last evisit. You may need injectable antiobiotics.  NOTE: There will be NO CHARGE for this eVisit   If you are having a true medical emergency please call 911.      For an urgent face to face visit, [facility] has six urgent care centers for your convenience:     [facility] at [facility] Directions [phone] [address] [place], [place] [address]    [facility] [place]) Get Driving Directions [phone] [address] [place], [place] [address]  [facility] [place]) Get Driving Directions [phone] [address] [place],  [place]  [address]  [facility] Urgent Care at [facility] Get Driving Directions [phone] [address], [address] [place], [place] [address]   [facility] Urgent Care at [facility] Get Driving Directions  [phone] [address] [place], [place] [address]   [facility] Urgent Care at [facility] Directions [phone] [address], [address] [place], [place] [address]  Your MyChart E-visit questionnaire answers were reviewed by a board certified advanced clinical practitioner to complete your personal care plan based on your specific symptoms.  Thank you for using e-Visits.

## 2021-08-06 ENCOUNTER — Encounter (HOSPITAL_COMMUNITY): Payer: Self-pay | Admitting: *Deleted

## 2021-08-06 ENCOUNTER — Emergency Department (HOSPITAL_COMMUNITY)
Admission: EM | Admit: 2021-08-06 | Discharge: 2021-08-06 | Disposition: A | Discharge status: Home / Self Care | Payer: Self-pay | Attending: Emergency Medicine | Admitting: Emergency Medicine

## 2021-08-06 DIAGNOSIS — K047 Periapical abscess without sinus: Secondary | ICD-10-CM | POA: Insufficient documentation

## 2021-08-06 DIAGNOSIS — K029 Dental caries, unspecified: Secondary | ICD-10-CM | POA: Insufficient documentation

## 2021-08-06 MED ORDER — NAPROXEN 500 MG PO TABS: 500.0000 mg | ORAL_TABLET | Freq: Two times a day (BID) | ORAL | 0 refills | Status: DC

## 2021-08-06 MED ORDER — CLINDAMYCIN HCL 300 MG PO CAPS: 300.0000 mg | ORAL_CAPSULE | Freq: Four times a day (QID) | ORAL | 0 refills | Status: AC

## 2021-08-06 MED ORDER — CLINDAMYCIN HCL 150 MG PO CAPS
300.0000 mg | ORAL_CAPSULE | Freq: Once | ORAL | Status: AC
  Administered 2021-08-06: 300 mg via ORAL
  Filled 2021-08-06: qty 2

## 2021-08-06 NOTE — Discharge Instructions (Addendum)
Please follow-up with a dentist of your choice within the next 2 weeks.  ER for worsening symptoms.

## 2021-08-06 NOTE — ED Triage Notes (Signed)
Swelling left upper jaw

## 2021-08-06 NOTE — ED Provider Notes (Signed)
[facility] EMERGENCY DEPARTMENT Provider Note   CSN: [id number] Arrival date & time: 08/06/21  1425     History  Chief Complaint  Patient presents with   Abscess    Rachel Hopkins is a 46 y.o. female.   Abscess Associated symptoms: no fever    This patient is a 46 year old female, she denies chronic medical conditions and states that she takes no daily medications.  Unfortunately she has struggled with poor dentition and has had multiple teeth which have been carried all the way down to the gumline.  She states that over the last several days she has had progressive swelling of the left upper jaw, she tried to do an E-visit however because of the frequency of her need for dental abscess treatment they referred her to the emergency department to make sure it was not too big to need some type of intervention.  She denies fevers, chills, she is still able to open and close her mouth and is able to swallow food without difficulty, no changes in her voice.  Taking ibuprofen for pain  Home Medications Prior to Admission medications   Medication Sig Start Date End Date Taking? Authorizing Provider  clindamycin (CLEOCIN) 300 MG capsule Take 1 capsule (300 mg total) by mouth 4 (four) times daily for 10 days. May dispense as 150mg  capsules 08/06/21 08/16/21 Yes 10/14/21, MD  naproxen (NAPROSYN) 500 MG tablet Take 1 tablet (500 mg total) by mouth 2 (two) times daily with a meal. 08/06/21  Yes 08/08/21, MD      Allergies    Eber Hong allergy]    Review of Systems   Review of Systems  Constitutional:  Negative for fever.  HENT:  Positive for dental problem.    Physical Exam Updated Vital Signs BP (!) 133/97 (BP Location: Right Arm)    Pulse (!) 105    Temp 98.9 F (37.2 C) (Oral)    Resp 18    SpO2 94%  Physical Exam Vitals and nursing note reviewed.  Constitutional:      Appearance: She is well-developed. She is not diaphoretic.  HENT:     Head: Normocephalic and  atraumatic.     Comments: The patient has no trismus or torticollis, there is no lymphadenopathy of the neck, she does have some swelling and tenderness to the left upper maxillary region over the premolar and molar region.  There is no fluctuant area but there is a slight fullness of the cheek, this does not involve the periorbital tissues and she is able to move her eye and full range of motion without pain or difficulty.  There is no redness of the skin over the face Eyes:     General:        Right eye: No discharge.        Left eye: No discharge.     Conjunctiva/sclera: Conjunctivae normal.  Pulmonary:     Effort: Pulmonary effort is normal. No respiratory distress.  Skin:    General: Skin is warm and dry.     Findings: No erythema or rash.  Neurological:     Mental Status: She is alert.     Coordination: Coordination normal.    ED Results / Procedures / Treatments   Labs (all labs ordered are listed, but only abnormal results are displayed) Labs Reviewed - No data to display  EKG None  Radiology No results found.  Procedures Procedures    Medications Ordered in ED Medications  clindamycin (  CLEOCIN) capsule 300 mg (has no administration in time range)    ED Course/ Medical Decision Making/ A&P                           Medical Decision Making This patient has what appears to be a early dental abscess, she has no fever and is not in distress, opening closing her mouth without difficulty, she is not febrile, she is not hypotensive, I have encouraged her to follow-up with dentistry, she states that she knows that she needs to have the tooth out but is scared to go to the dentist.  She will be given clindamycin and will arrange follow-up.  Amount and/or Complexity of Data Reviewed External Data Reviewed: notes.    Details: Multiple prior visits and ED visits for same condition Discussion of management or test interpretation with external provider(s): No labs or testing  needed, patient given clindamycin, stable for discharge  I did consider performing CT scan however given the absence of hard evidence of abscess that would need drainage at this time a CT scan will be postponed unless patient does not improve, she is able to express her understanding to the plan  Risk OTC drugs. Prescription drug management.           Final Clinical Impression(s) / ED Diagnoses Final diagnoses:  Dental abscess    Rx / DC Orders ED Discharge Orders          Ordered    clindamycin (CLEOCIN) 300 MG capsule  4 times daily        08/06/21 1459    naproxen (NAPROSYN) 500 MG tablet  2 times daily with meals        08/06/21 1459              Eber Hong, MD 08/06/21 1501

## 2022-08-25 ENCOUNTER — Emergency Department (HOSPITAL_COMMUNITY): Payer: Self-pay

## 2022-08-25 ENCOUNTER — Emergency Department (HOSPITAL_COMMUNITY)
Admission: EM | Admit: 2022-08-25 | Discharge: 2022-08-25 | Disposition: A | Discharge status: Home / Self Care | Payer: Self-pay | Attending: Emergency Medicine | Admitting: Emergency Medicine

## 2022-08-25 DIAGNOSIS — K5900 Constipation, unspecified: Secondary | ICD-10-CM | POA: Insufficient documentation

## 2022-08-25 DIAGNOSIS — K5903 Drug induced constipation: Secondary | ICD-10-CM

## 2022-08-25 LAB — CBC WITH DIFFERENTIAL/PLATELET
Abs Immature Granulocytes: 0.04 10*3/uL (ref 0.00–0.07)
Basophils Absolute: 0.1 10*3/uL (ref 0.0–0.1)
Basophils Relative: 1 %
Eosinophils Absolute: 0.2 10*3/uL (ref 0.0–0.5)
Eosinophils Relative: 2 %
HCT: 45.5 % (ref 36.0–46.0)
Hemoglobin: 14.4 g/dL (ref 12.0–15.0)
Immature Granulocytes: 0 %
Lymphocytes Relative: 22 %
Lymphs Abs: 2.3 10*3/uL (ref 0.7–4.0)
MCH: 29.3 pg (ref 26.0–34.0)
MCHC: 31.6 g/dL (ref 30.0–36.0)
MCV: 92.5 fL (ref 80.0–100.0)
Monocytes Absolute: 0.7 10*3/uL (ref 0.1–1.0)
Monocytes Relative: 7 %
Neutro Abs: 7.5 10*3/uL (ref 1.7–7.7)
Neutrophils Relative %: 68 %
Platelets: 205 10*3/uL (ref 150–400)
RBC: 4.92 MIL/uL (ref 3.87–5.11)
RDW: 12.9 % (ref 11.5–15.5)
WBC: 10.8 10*3/uL — ABNORMAL HIGH (ref 4.0–10.5)
nRBC: 0 % (ref 0.0–0.2)

## 2022-08-25 LAB — URINALYSIS, W/ REFLEX TO CULTURE (INFECTION SUSPECTED)
Bilirubin Urine: NEGATIVE
Glucose, UA: NEGATIVE mg/dL
Ketones, ur: NEGATIVE mg/dL
Nitrite: NEGATIVE
Protein, ur: NEGATIVE mg/dL
Specific Gravity, Urine: 1.011 (ref 1.005–1.030)
pH: 6 (ref 5.0–8.0)

## 2022-08-25 LAB — COMPREHENSIVE METABOLIC PANEL
ALT: 31 U/L (ref 0–44)
AST: 28 U/L (ref 15–41)
Albumin: 3.9 g/dL (ref 3.5–5.0)
Alkaline Phosphatase: 75 U/L (ref 38–126)
Anion gap: 11 (ref 5–15)
BUN: 13 mg/dL (ref 6–20)
CO2: 23 mmol/L (ref 22–32)
Calcium: 8.8 mg/dL — ABNORMAL LOW (ref 8.9–10.3)
Chloride: 105 mmol/L (ref 98–111)
Creatinine, Ser: 0.94 mg/dL (ref 0.44–1.00)
GFR, Estimated: >60 mL/min (ref >60)
Glucose, Bld: 97 mg/dL (ref 70–99)
Potassium: 4.3 mmol/L (ref 3.5–5.1)
Sodium: 139 mmol/L (ref 135–145)
Total Bilirubin: 0.7 mg/dL (ref 0.3–1.2)
Total Protein: 7.3 g/dL (ref 6.5–8.1)

## 2022-08-25 LAB — PREGNANCY, URINE: Preg Test, Ur: NEGATIVE

## 2022-08-25 MED ORDER — CEPHALEXIN 500 MG PO CAPS
500.0000 mg | ORAL_CAPSULE | Freq: Once | ORAL | Status: AC
  Administered 2022-08-25: 500 mg via ORAL
  Filled 2022-08-25: qty 1

## 2022-08-25 MED ORDER — IOHEXOL 300 MG/ML  SOLN
100.0000 mL | Freq: Once | INTRAMUSCULAR | Status: AC | PRN
  Administered 2022-08-25: 100 mL via INTRAVENOUS

## 2022-08-25 MED ORDER — CEPHALEXIN 500 MG PO CAPS: 500.0000 mg | ORAL_CAPSULE | Freq: Three times a day (TID) | ORAL | 0 refills | Status: AC

## 2022-08-25 NOTE — Discharge Instructions (Signed)
It may be that that medication is giving you the constipation so please discuss this with your doctor immediately.  In the meantime I do want you to start taking the following medications  MiraLAX, 1 scoop 3 times daily until you are having regular soft bowel movements.  Take Colace twice a day, drink 1 bottle of magnesium citrate.  The CT scan showed that you may have an early urinary infection.  Please take cephalexin 3 times a day as prescribed just in case.  The lab testing was unremarkable overall.  ER for worsening symptoms

## 2022-08-25 NOTE — ED Provider Notes (Signed)
[facility] Provider Note   CSN: [id number] Arrival date & time: 08/25/22  1232     History  Chief Complaint  Patient presents with   Constipation    Rachel Hopkins is a 47 y.o. female.   Constipation Patient resents constipation.  No real bowel movement in the last 5 days.  Has some small bowel movements and attempted manual disimpaction without much relief.  Does have abdominal pain.  No vomiting.  May have some mild nausea.  Began to have more symptoms after starting Trulicity.  Denies pregnancy.  Denies fevers.  States having more pain.    Past Medical History:  Diagnosis Date   Asthma    Hypertension    No past surgical history on file.   Home Medications Prior to Admission medications   Medication Sig Start Date End Date Taking? Authorizing Provider  naproxen (NAPROSYN) 500 MG tablet Take 1 tablet (500 mg total) by mouth 2 (two) times daily with a meal. 08/06/21   Noemi Chapel, MD      Allergies    Myrtis Hopping allergy]    Review of Systems   Review of Systems  Gastrointestinal:  Positive for constipation.    Physical Exam Updated Vital Signs BP 114/89   Pulse 94   Temp 98.6 F (37 C) (Oral)   Resp 18   Ht 5' 7"$  (1.702 m)   Wt (!) 159.2 kg   SpO2 97%   BMI 54.97 kg/m  Physical Exam Vitals and nursing note reviewed.  Constitutional:      Appearance: She is obese.  Abdominal:     Tenderness: There is abdominal tenderness.     Comments: Tenderness lower abdomen.  May have fullness right mid to lower abdomen.  Genitourinary:    Comments: Rectal exam showed what may be some mild tenderness but no masses felt and no stool within reach of my finger. Musculoskeletal:     Cervical back: Neck supple.  Skin:    Capillary Refill: Capillary refill takes less than 2 seconds.  Neurological:     Mental Status: She is alert and oriented to person, place, and time.     ED Results / Procedures / Treatments    Labs (all labs ordered are listed, but only abnormal results are displayed) Labs Reviewed  CBC WITH DIFFERENTIAL/PLATELET - Abnormal; Notable for the following components:      Result Value   WBC 10.8 (*)    All other components within normal limits  COMPREHENSIVE METABOLIC PANEL - Abnormal; Notable for the following components:   Calcium 8.8 (*)    All other components within normal limits  URINALYSIS, W/ REFLEX TO CULTURE (INFECTION SUSPECTED)  PREGNANCY, URINE    EKG None  Radiology No results found.  Procedures Procedures    Medications Ordered in ED Medications - No data to display  ED Course/ Medical Decision Making/ A&P                             Medical Decision Making Amount and/or Complexity of Data Reviewed Labs: ordered. Radiology: ordered.   Patient with abdominal pain.  Constipation.  Has had for the last few days.  Lab work showed white count mildly elevated.  Is on Trulicity which can cause constipation.  No relief with disimpaction at home.  Does have tenderness.  With tenderness will get CT scan.  Also reportedly has been on antibiotics for  UTI.  Has been having difficulty urinating.  Care turned over to Dr Sabra Heck        Final Clinical Impression(s) / ED Diagnoses Final diagnoses:  None    Rx / DC Orders ED Discharge Orders     None         Davonna Belling, MD 08/25/22 [phone]

## 2022-08-25 NOTE — ED Provider Notes (Signed)
The patient is not having any urinary symptoms or back pain but with CT scan findings will initiate cephalexin for possible UTI.  CT scan confirms no other significant sources of abnormalities, there is some constipation, recommendations given, vital signs normal, patient agreeable to plan   Rachel Chapel, MD 08/25/22 1715

## 2022-08-25 NOTE — ED Triage Notes (Signed)
Pt reports constipation after starting new medication, no relief with OTC enema or medications. Reports has tried to manual remove stool from rectum with little success.

## 2022-08-25 NOTE — ED Notes (Signed)
E-signature broken, pt gives verbal consent

## 2022-08-27 ENCOUNTER — Telehealth: Payer: Self-pay

## 2022-08-27 NOTE — Telephone Encounter (Signed)
Attempted call today for ER follow up from visit on 08/25/22. She is enrolled in [facility] and has an upcoming appt with [facility] on 08/28/22.  No answer today and client does not have VM setup.   [place] [employer]

## 2022-08-31 ENCOUNTER — Telehealth: Payer: Self-pay

## 2022-08-31 NOTE — Telephone Encounter (Signed)
Attempted call to follow up with client after ER visit on 08/25/22, Her next appointment with [facility] has been rescheduled for 09/07/22.  No answer, no voicemail set up. 2nd Attempt  Will attempt again to contact [facility] client on 09/03/22.  [facility]

## 2022-09-03 ENCOUNTER — Telehealth: Payer: Self-pay

## 2022-09-03 NOTE — Telephone Encounter (Signed)
Called to check in with [facility] client who was recently seen in [facility] on 08/25/22. She states she is much improved, no further issues, no further abdominal pain. She states she has all her medications. Issues were over the weekend 08/25/22 she was instructed to discuss her medications and  possible causes of issues. Confirmed she has an upcoming appointment on 09/07/22 at 80 am with [facility] her primary care provider. She states she has no other needs for resources at this time. Did discuss the [facility] food market and hours of operation and how often she could use the pantry, weekly if needed. Encouraged her to drop in and take a visit. She appreciates this information and will use if needed.  [place] [employer]

## 2022-09-11 ENCOUNTER — Emergency Department (HOSPITAL_COMMUNITY)
Admission: EM | Admit: 2022-09-11 | Discharge: 2022-09-11 | Disposition: A | Discharge status: Home / Self Care | Payer: Self-pay | Attending: Emergency Medicine | Admitting: Emergency Medicine

## 2022-09-11 ENCOUNTER — Telehealth: Payer: Self-pay

## 2022-09-11 ENCOUNTER — Other Ambulatory Visit: Payer: Self-pay

## 2022-09-11 ENCOUNTER — Encounter (HOSPITAL_COMMUNITY): Payer: Self-pay | Admitting: Emergency Medicine

## 2022-09-11 DIAGNOSIS — F17210 Nicotine dependence, cigarettes, uncomplicated: Secondary | ICD-10-CM | POA: Insufficient documentation

## 2022-09-11 DIAGNOSIS — J45909 Unspecified asthma, uncomplicated: Secondary | ICD-10-CM | POA: Insufficient documentation

## 2022-09-11 DIAGNOSIS — H1032 Unspecified acute conjunctivitis, left eye: Secondary | ICD-10-CM | POA: Insufficient documentation

## 2022-09-11 DIAGNOSIS — I10 Essential (primary) hypertension: Secondary | ICD-10-CM | POA: Insufficient documentation

## 2022-09-11 MED ORDER — TETRACAINE HCL 0.5 % OP SOLN
2.0000 [drp] | Freq: Once | OPHTHALMIC | Status: AC
  Administered 2022-09-11: 2 [drp] via OPHTHALMIC
  Filled 2022-09-11: qty 4

## 2022-09-11 MED ORDER — FLUORESCEIN SODIUM 1 MG OP STRP
1.0000 | ORAL_STRIP | Freq: Once | OPHTHALMIC | Status: AC
  Administered 2022-09-11: 1 via OPHTHALMIC
  Filled 2022-09-11: qty 1

## 2022-09-11 MED ORDER — ERYTHROMYCIN 5 MG/GM OP OINT: 1.0000 | TOPICAL_OINTMENT | Freq: Four times a day (QID) | OPHTHALMIC | 0 refills | Status: AC

## 2022-09-11 NOTE — Telephone Encounter (Signed)
Attempted follow up of [facility] client from ER visit on 09/11/22.  (First Attempt) Person who answered the phone states client is asleep.    Plan; will follow up on Thursday 09/13/22  [place] [facility]

## 2022-09-11 NOTE — ED Provider Notes (Signed)
[facility] Emergency Department Provider Note MRN:  [id number]  Arrival date & time: 09/11/22     Chief Complaint   L eye Problem   History of Present Illness   Rachel Hopkins is a 47 y.o. year-old female with a history of hypertension presenting to the ED with chief complaint of eye problem.  Left eye irritation, redness, watering.  Present for 2 or 3 days.  Denies vision loss, denies trauma to the eye.  Does not wear contact lenses.  Prior to the eye irritation she was experiencing what she describes as a sinus infection.  Review of Systems  A thorough review of systems was obtained and all systems are negative except as noted in the HPI and PMH.   Patient's Health History    Past Medical History:  Diagnosis Date   Asthma    Hypertension     History reviewed. No pertinent surgical history.  History reviewed. No pertinent family history.  Social History   Socioeconomic History   Marital status: Married    Spouse name: Not on file   Number of children: Not on file   Years of education: Not on file   Highest education level: Not on file  Occupational History   Not on file  Tobacco Use   Smoking status: Every Day    Packs/day: 0.50    Types: Cigarettes   Smokeless tobacco: Never  Substance and Sexual Activity   Alcohol use: Yes    Comment: occasionally   Drug use: Yes    Types: Marijuana   Sexual activity: Yes    Birth control/protection: None  Other Topics Concern   Not on file  Social History Narrative   Not on file   Social Determinants of Health   Financial Resource Strain: Not on file  Food Insecurity: Not on file  Transportation Needs: Not on file  Physical Activity: Not on file  Stress: Not on file  Social Connections: Not on file  Intimate Partner Violence: Not on file     Physical Exam   Vitals:   09/11/22 0151  BP: (!) 145/99  Pulse: 97  Resp: 18  Temp: 97.8 F (36.6 C)  SpO2: 97%    CONSTITUTIONAL:  Well-appearing, NAD NEURO/PSYCH:  Alert and oriented x 3, no focal deficits EYES:  eyes equal and reactive ENT/NECK:  no LAD, no JVD CARDIO: Regular rate, well-perfused, normal S1 and S2 PULM:  CTAB no wheezing or rhonchi GI/GU:  non-distended, non-tender MSK/SPINE:  No gross deformities, no edema SKIN:  no rash, atraumatic   *Additional and/or pertinent findings included in MDM below  Diagnostic and Interventional Summary    EKG Interpretation  Date/Time:    Ventricular Rate:    PR Interval:    QRS Duration:   QT Interval:    QTC Calculation:   R Axis:     Text Interpretation:         Labs Reviewed - No data to display  No orders to display    Medications  fluorescein ophthalmic strip 1 strip (1 strip Both Eyes Given by Other 09/11/22 0239)  tetracaine (PONTOCAINE) 0.5 % ophthalmic solution 2 drop (2 drops Both Eyes Given by Other 09/11/22 0239)     Procedures  /  Critical Care Procedures  ED Course and Medical Decision Making  Initial Impression and Ddx The left eye has diffuse conjunctival erythema and is exhibiting watery discharge.  Visual acuity is intact, pupillary response is normal, normal extraocular movements.  Given the prodrome of sinus infection favoring a viral conjunctivitis.  Doubt uveitis, doubt acute angle-closure glaucoma.  Corneal abrasion or ulcer is also considered.  Past medical/surgical history that increases complexity of ED encounter: None  Interpretation of Diagnostics Laboratory and/or imaging options to aid in the diagnosis/care of the patient were considered.  After careful history and physical examination, it was determined that there was no indication for diagnostics at this time.  Patient Reassessment and Ultimate Disposition/Management     No signs of corneal abrasion or ulcer with fluorescein staining.  Appropriate for discharge with return precautions.  Patient management required discussion with the following services or  consulting groups:  None  Complexity of Problems Addressed Acute complicated illness or Injury  Additional Data Reviewed and Analyzed Further history obtained from: None  Additional Factors Impacting ED Encounter Risk Prescriptions  Barth Kirks. [place] Small, [place] [email or web address]  Final Clinical Impressions(s) / ED Diagnoses     ICD-10-CM   1. Acute conjunctivitis of left eye, unspecified acute conjunctivitis type  H10.32       ED Discharge Orders          Ordered    erythromycin ophthalmic ointment  4 times daily        09/11/22 0240             Discharge Instructions Discussed with and Provided to Patient:     Discharge Instructions      You were evaluated in the Emergency Department and after careful evaluation, we did not find any emergent condition requiring admission or further testing in the hospital.  Your exam/testing today was overall reassuring.  Symptoms likely due to conjunctivitis.  Use the antibiotic ointment as directed.  Use Tylenol or Motrin for discomfort.  Recommend following up with an eye doctor if not improving over the next few days.  Please return to the Emergency Department if you experience any worsening of your condition.  Thank you for allowing [place] to be a part of your care.        Maudie Flakes, MD 09/11/22 [phone]

## 2022-09-11 NOTE — ED Triage Notes (Signed)
Pt states she cannot open L eye and has trouble opening the other one. States L eye is leaking. States she believes she has a sinus infection that has "moved to her eyes".

## 2022-09-11 NOTE — Discharge Instructions (Signed)
You were evaluated in the Emergency Department and after careful evaluation, we did not find any emergent condition requiring admission or further testing in the hospital.  Your exam/testing today was overall reassuring.  Symptoms likely due to conjunctivitis.  Use the antibiotic ointment as directed.  Use Tylenol or Motrin for discomfort.  Recommend following up with an eye doctor if not improving over the next few days.  Please return to the Emergency Department if you experience any worsening of your condition.  Thank you for allowing [place] to be a part of your care.

## 2022-09-13 ENCOUNTER — Emergency Department (HOSPITAL_COMMUNITY)
Admission: EM | Admit: 2022-09-13 | Discharge: 2022-09-13 | Disposition: A | Discharge status: Home / Self Care | Payer: Self-pay | Attending: Emergency Medicine | Admitting: Emergency Medicine

## 2022-09-13 ENCOUNTER — Other Ambulatory Visit: Payer: Self-pay

## 2022-09-13 ENCOUNTER — Encounter (HOSPITAL_COMMUNITY): Payer: Self-pay

## 2022-09-13 DIAGNOSIS — H209 Unspecified iridocyclitis: Secondary | ICD-10-CM | POA: Insufficient documentation

## 2022-09-13 DIAGNOSIS — Z1152 Encounter for screening for COVID-19: Secondary | ICD-10-CM | POA: Insufficient documentation

## 2022-09-13 DIAGNOSIS — H5712 Ocular pain, left eye: Secondary | ICD-10-CM

## 2022-09-13 LAB — RESP PANEL BY RT-PCR (RSV, FLU A&B, COVID)  RVPGX2
Influenza A by PCR: NEGATIVE
Influenza B by PCR: NEGATIVE
Resp Syncytial Virus by PCR: NEGATIVE
SARS Coronavirus 2 by RT PCR: NEGATIVE

## 2022-09-13 MED ORDER — OXYCODONE-ACETAMINOPHEN 5-325 MG PO TABS: 1.0000 | ORAL_TABLET | Freq: Three times a day (TID) | ORAL | 0 refills | Status: DC | PRN

## 2022-09-13 MED ORDER — FLUORESCEIN SODIUM 1 MG OP STRP
1.0000 | ORAL_STRIP | Freq: Once | OPHTHALMIC | Status: AC
  Administered 2022-09-13: 1 via OPHTHALMIC

## 2022-09-13 MED ORDER — ONDANSETRON 4 MG PO TBDP
4.0000 mg | ORAL_TABLET | Freq: Once | ORAL | Status: AC
  Administered 2022-09-13: 4 mg via ORAL
  Filled 2022-09-13: qty 1

## 2022-09-13 MED ORDER — TETRACAINE HCL 0.5 % OP SOLN
2.0000 [drp] | Freq: Once | OPHTHALMIC | Status: AC
  Administered 2022-09-13: 2 [drp] via OPHTHALMIC
  Filled 2022-09-13: qty 4

## 2022-09-13 NOTE — ED Triage Notes (Signed)
Complaining of left eye pain, drainage that started a couple of days ago, was started on erythromycin ointment. Pain is worse and it is draining more. Also complaining of upper respiratory symptoms for a couple of days.

## 2022-09-13 NOTE — Progress Notes (Signed)
While going over discharge papers, pt reports she is having chest pain and is rubbing left side of chest.  Pt states that she has been having chest pain since last noc.  States that is part of the reason she came in today and asked for a covid test.  Pt states that she did not discuss this with provider during exam because she was not experiencing pain at that time, but when she got up to use the bathroom her pain recurred and radiated to her back.  Pt denies cardiac history.  Denies SHOB.  EKG performed, showed NSR.  Reviewed with provider, ok to discharge.  Pt then reported she has been taking an increased amount of ibuprofen for her eye pain.  Suggested to pt that she ensure she takes ibuprofen with food, alternate or use tylenol instead of ibuprofen, and consider OTC meds such as pepcid.  Pt verbalizes understanding of instructions.  Encouraged pt to return if symptoms persist or worsen.  Pt ambulatory on discharge, skin warm/dry, no distress.

## 2022-09-13 NOTE — ED Provider Notes (Signed)
[facility] Provider Note   CSN: [id number] Arrival date & time: 09/13/22  1831     History  Chief Complaint  Patient presents with   Eye Pain    Rachel Hopkins is a 47 y.o. female.   Eye Pain  Patient presents with eye pain.  Drainage.  Clear drainage.  Seen in the ER for the same 2 days ago.  Now more draining and more pain.  States decreased vision in the eye.  States that everything looks blurred out of that eye.  No pain with moving the eye although the light does bother her.  No fevers.  Had been using erythromycin ointment and states it does not help Past Medical History:  Diagnosis Date   Asthma    Hypertension         Home Medications Prior to Admission medications   Medication Sig Start Date End Date Taking? Authorizing Provider  oxyCODONE-acetaminophen (PERCOCET/ROXICET) 5-325 MG tablet Take 1-2 tablets by mouth every 8 (eight) hours as needed for severe pain. 09/13/22  Yes Davonna Belling, MD  erythromycin ophthalmic ointment Place 1 Application into the left eye 4 (four) times daily for 5 days. Place a 1/2 inch ribbon of ointment into the lower eyelid. 09/11/22 09/16/22  Maudie Flakes, MD  naproxen (NAPROSYN) 500 MG tablet Take 1 tablet (500 mg total) by mouth 2 (two) times daily with a meal. 08/06/21   Noemi Chapel, MD      Allergies    Myrtis Hopping allergy]    Review of Systems   Review of Systems  Eyes:  Positive for pain.    Physical Exam Updated Vital Signs BP (!) 133/112 (BP Location: Right Arm)   Pulse 98   Temp 98.1 F (36.7 C) (Oral)   Resp 18   Ht '5\' 7"'$  (1.702 m)   Wt (!) 159.2 kg   SpO2 97%   BMI 54.97 kg/m  Physical Exam Vitals and nursing note reviewed.  Constitutional:      Appearance: She is obese.  Eyes:     Comments: Injection and swelling of conjunctiva on left.  Does have photophobia.  Eye movements appear grossly intact.  Does have some lid edema.  Neurological:     Mental  Status: She is alert.   Intraocular pressure on left 20.  No corneal uptake on fluorescein exam.  ED Results / Procedures / Treatments   Labs (all labs ordered are listed, but only abnormal results are displayed) Labs Reviewed  RESP PANEL BY RT-PCR (RSV, FLU A&B, COVID)  [id number]    EKG None  Radiology No results found.  Procedures Procedures    Medications Ordered in ED Medications  tetracaine (PONTOCAINE) 0.5 % ophthalmic solution 2 drop (2 drops Left Eye Given 09/13/22 1915)  fluorescein ophthalmic strip 1 strip (1 strip Left Eye Given 09/13/22 1915)  ondansetron (ZOFRAN-ODT) disintegrating tablet 4 mg (4 mg Oral Given 09/13/22 2023)    ED Course/ Medical Decision Making/ A&P                             Medical Decision Making Risk Prescription drug management.   Patient with left eye pain.  Conjunctival injection with edema.  Some lid edema also.  Doubt severe infection.  Painless range of motion.  Discussed with Dr. Carolynn Sayers.  We think it is likely iritis.  She can see in the office with him tomorrow morning.  Although sometimes has difficulty with transportation.  Will give pain medicine.        Final Clinical Impression(s) / ED Diagnoses Final diagnoses:  Left eye pain  Iritis    Rx / DC Orders ED Discharge Orders          Ordered    oxyCODONE-acetaminophen (PERCOCET/ROXICET) 5-325 MG tablet  Every 8 hours PRN        09/13/22 2035              Davonna Belling, MD 09/13/22 2036

## 2022-09-14 ENCOUNTER — Telehealth: Payer: Self-pay

## 2022-09-14 NOTE — Telephone Encounter (Signed)
Attempting to call for follow up with [facility] client regarding ER visit from 09/11/22 at [facility] 2nd attempt to reach, noted also that Client had another ER visit at [facility] 09/13/22. No answer today and unable to leave a voicemail and no voicemail box set up.  Client's primary care is [facility] next appt 09/20/22.   Plan: will continue to attempt to reach client and determine any needs or barriers she may have accessing care.   [place] [employer]

## 2022-09-18 ENCOUNTER — Telehealth: Payer: Self-pay

## 2022-09-18 NOTE — Telephone Encounter (Signed)
2nd attempt to follow up with [facility] client after recent ER visit on 09/13/22. No answer and does not have VM set up. Will attempt again later date.  Note: client has an upcoming appointment with [facility] on 09/20/22.   [place] [employer]

## 2022-09-20 ENCOUNTER — Telehealth: Payer: Self-pay

## 2022-09-20 NOTE — Telephone Encounter (Signed)
Attempted follow up from recent ER visit with [facility] client, no answer and no VM set up. Client is scheduled to be seen at [facility] today.   Will plan to attempt again later today.  [place] [employer]

## 2023-03-13 ENCOUNTER — Other Ambulatory Visit (HOSPITAL_COMMUNITY): Payer: Self-pay | Admitting: Obstetrics and Gynecology

## 2023-03-13 DIAGNOSIS — R2231 Localized swelling, mass and lump, right upper limb: Secondary | ICD-10-CM

## 2023-03-13 DIAGNOSIS — R2232 Localized swelling, mass and lump, left upper limb: Secondary | ICD-10-CM

## 2023-03-28 ENCOUNTER — Telehealth: Payer: Self-pay

## 2023-03-28 NOTE — Telephone Encounter (Signed)
Attempted follow up call of [facility] client. No answer unable to leave vm.   Next appointment at [facility] is scheduled for 04/29/23   Francee Nodal RN [facility]

## 2023-03-29 ENCOUNTER — Inpatient Hospital Stay: Payer: Self-pay | Attending: Hematology | Admitting: Hematology and Oncology

## 2023-03-29 VITALS — BP 135/91 | Wt 349.6 lb

## 2023-03-29 DIAGNOSIS — R2233 Localized swelling, mass and lump, upper limb, bilateral: Secondary | ICD-10-CM

## 2023-03-29 NOTE — Patient Instructions (Signed)
Taught Rachel Hopkins about self breast awareness and gave educational materials to take home. Patient did not need a Pap smear today due to last Pap smear was in 2024 per patient. Let her know [facility] will cover Pap smears every 5 years unless has a history of abnormal Pap smears. Referred patient to the [facility] for diagnostic mammogram. Appointment scheduled for 04/16/2023. Patient aware of appointment and will be there. Let patient know will follow up with her within the next couple weeks with results. Rachel Hopkins verbalized understanding.  Pascal Lux, NP 11:19 AM

## 2023-03-29 NOTE — Progress Notes (Signed)
[name] is a 47 y.o. female who presents to [facility] clinic today with complaint of bilateral axillary nodules.    Pap Smear: Pap not smear completed today. Last Pap smear was 2024 and was normal. Per patient has no history of an abnormal Pap smear. Last Pap smear result is not available in Epic. We will obtain copy of results.    Physical exam: Breasts Breasts symmetrical. No skin abnormalities bilateral breasts. No nipple retraction bilateral breasts. No nipple discharge bilateral breasts. No lymphadenopathy. No lumps palpated bilateral breasts.       Pelvic/Bimanual Pap is not indicated today    Smoking History: Patient has is a former smoker and was not referred to quit line.    Patient Navigation: Patient education provided. Access to services provided for patient through [facility] program. No interpreter provided. No transportation provided   Colorectal Cancer Screening: Per patient has never had colonoscopy completed No complaints today. FIT test completed this year.    Breast and Cervical Cancer Risk Assessment: Patient does not have family history of breast cancer, known genetic mutations, or radiation treatment to the chest before age 55. Patient does not have history of cervical dysplasia, immunocompromised, or DES exposure in-utero.  Risk Scores as of Encounter on 03/29/2023     Rachel Hopkins           5-year 0.72%   Lifetime 7.1%            Last calculated by Narda Rutherford, LPN on 8/65/7846 at 10:55 AM        A: [facility] exam without pap smear Complaint of bilateral axillary lumps with benign exam.   P: Referred patient to the [facility] for a diagnostic mammogram. Appointment scheduled 04/16/2023.  Ilda Basset A, NP 03/29/2023 11:09 AM

## 2023-04-16 ENCOUNTER — Encounter (HOSPITAL_COMMUNITY): Payer: Self-pay

## 2023-04-16 ENCOUNTER — Ambulatory Visit (HOSPITAL_COMMUNITY)
Admission: RE | Admit: 2023-04-16 | Discharge: 2023-04-16 | Disposition: A | Discharge status: Home / Self Care | Payer: Self-pay | Source: Ambulatory Visit | Attending: Obstetrics and Gynecology | Admitting: Obstetrics and Gynecology

## 2023-04-16 DIAGNOSIS — R2231 Localized swelling, mass and lump, right upper limb: Secondary | ICD-10-CM

## 2023-04-16 DIAGNOSIS — R2232 Localized swelling, mass and lump, left upper limb: Secondary | ICD-10-CM

## 2023-05-31 ENCOUNTER — Telehealth: Payer: Self-pay | Admitting: Family Medicine

## 2023-05-31 DIAGNOSIS — K047 Periapical abscess without sinus: Secondary | ICD-10-CM

## 2023-05-31 MED ORDER — PENICILLIN V POTASSIUM 500 MG PO TABS: 500.0000 mg | ORAL_TABLET | Freq: Three times a day (TID) | ORAL | 0 refills | Status: AC

## 2023-05-31 NOTE — Addendum Note (Signed)
Addended by: Georgana Curio on: 05/31/2023 09:35 AM   Modules accepted: Orders

## 2023-05-31 NOTE — Progress Notes (Signed)

## 2023-08-22 ENCOUNTER — Encounter (HOSPITAL_COMMUNITY): Payer: Self-pay

## 2023-08-22 ENCOUNTER — Emergency Department (HOSPITAL_COMMUNITY)
Admission: EM | Admit: 2023-08-22 | Discharge: 2023-08-22 | Disposition: A | Discharge status: Home / Self Care | Payer: Self-pay | Attending: Emergency Medicine | Admitting: Emergency Medicine

## 2023-08-22 DIAGNOSIS — E119 Type 2 diabetes mellitus without complications: Secondary | ICD-10-CM | POA: Insufficient documentation

## 2023-08-22 DIAGNOSIS — E669 Obesity, unspecified: Secondary | ICD-10-CM | POA: Insufficient documentation

## 2023-08-22 DIAGNOSIS — Z6841 Body Mass Index (BMI) 40.0 and over, adult: Secondary | ICD-10-CM | POA: Insufficient documentation

## 2023-08-22 DIAGNOSIS — I10 Essential (primary) hypertension: Secondary | ICD-10-CM | POA: Insufficient documentation

## 2023-08-22 DIAGNOSIS — Z79899 Other long term (current) drug therapy: Secondary | ICD-10-CM | POA: Insufficient documentation

## 2023-08-22 DIAGNOSIS — H209 Unspecified iridocyclitis: Secondary | ICD-10-CM | POA: Insufficient documentation

## 2023-08-22 DIAGNOSIS — J45909 Unspecified asthma, uncomplicated: Secondary | ICD-10-CM | POA: Insufficient documentation

## 2023-08-22 MED ORDER — FLUORESCEIN SODIUM 1 MG OP STRP
1.0000 | ORAL_STRIP | Freq: Once | OPHTHALMIC | Status: AC
  Administered 2023-08-22: 1 via OPHTHALMIC

## 2023-08-22 MED ORDER — TETRACAINE HCL 0.5 % OP SOLN
2.0000 [drp] | Freq: Once | OPHTHALMIC | Status: AC
  Administered 2023-08-22: 2 [drp] via OPHTHALMIC
  Filled 2023-08-22: qty 4

## 2023-08-22 NOTE — ED Triage Notes (Signed)
 Pt arrived via POV c/o right eye infection, possible foreign body in her eye. Pt presents with drainage from her eye. [facility] Tobramycin eye drops w/o relief.

## 2023-08-22 NOTE — ED Provider Notes (Signed)
 [place] EMERGENCY DEPARTMENT AT [facility] Provider Note   CSN: [id number] Arrival date & time: 08/22/23  1622     History  Chief Complaint  Patient presents with   Eye Drainage    LAIBA FUERTE is a 48 y.o. female.  HPI Patient presents with right eye pain and swelling and drainage.  Is had for around a week and a half now.  Has been seen at urgent care twice.  Had been on penicillin  pills along with eyedrops.  Later started on tobramycin drops later this week.  States pain and decreased vision in the right eye.  Had been seen in the ER for similar symptoms around a year ago by myself.  Diagnosed with possible iritis at that time.  States it went away and never followed up with ophthalmology.   Past Medical History:  Diagnosis Date   Asthma    Diabetes mellitus without complication (HCC)    Hypertension     Home Medications Prior to Admission medications   Medication Sig Start Date End Date Taking? Authorizing Provider  tobramycin (TOBREX) 0.3 % ophthalmic ointment Place 1 Application into the right eye 3 (three) times daily.   Yes [provider]  furosemide (LASIX) 20 MG tablet Take 20 mg by mouth daily. 05/01/19   [provider]  naproxen  (NAPROSYN ) 500 MG tablet Take 1 tablet (500 mg total) by mouth 2 (two) times daily with a meal. 08/06/21   Cleotilde Rogue, MD  oxyCODONE -acetaminophen  (PERCOCET/ROXICET) 5-325 MG tablet Take 1-2 tablets by mouth every 8 (eight) hours as needed for severe pain. 09/13/22   Patsey Lot, MD      Allergies    [place] gums allergy]    Review of Systems   Review of Systems  Physical Exam Updated Vital Signs BP (!) 129/103 (BP Location: Right Arm)   Pulse 91   Temp (!) 97.1 F (36.2 C) (Temporal)   Resp 20   Ht 5' 7 (1.702 m)   Wt (!) 166.9 kg   SpO2 99%   BMI 57.64 kg/m  Physical Exam Vitals and nursing note reviewed.  Constitutional:      Appearance: She is obese.  Eyes:     Extraocular  Movements: Extraocular movements intact.     Comments: Injection of right eye.  Does have some edema.  Pupil appears reactive but does have some photophobia.  Eye movement intact.  Musculoskeletal:     Cervical back: Neck supple.  Neurological:     Mental Status: She is alert.   Intraocular pressure of 28 on the right.  No corneal uptake.  ED Results / Procedures / Treatments   Labs (all labs ordered are listed, but only abnormal results are displayed) Labs Reviewed - No data to display  EKG None  Radiology No results found.  Procedures Procedures    Medications Ordered in ED Medications  tetracaine  (PONTOCAINE) 0.5 % ophthalmic solution 2 drop (2 drops Right Eye Given 08/22/23 2030)  fluorescein  ophthalmic strip 1 strip (1 strip Right Eye Given 08/22/23 2030)    ED Course/ Medical Decision Making/ A&P                                 Medical Decision Making Risk Prescription drug management.   Patient with eye pain.  Has been on antibiotics for.  Previous history of a likely iritis.  Likely infection versus iritis at this time.  Discussed with Dr. Loreli from ophthalmology.  Can see in the office at 1230 tomorrow.  Does not need acute intervention.  Stable for discharge home.        Final Clinical Impression(s) / ED Diagnoses Final diagnoses:  Iritis    Rx / DC Orders ED Discharge Orders     None         Patsey Lot, MD 08/23/23 1446

## 2023-08-27 ENCOUNTER — Telehealth: Payer: Self-pay

## 2023-08-27 NOTE — Telephone Encounter (Signed)
Attempted follow up call to [facility] client after recent visit to ER on 08/22/23 for complaint of Right eye infection. She had been taking Tobramycin eye drops for a little over a week with no improvement per notes.  She was referred for follow up to Dr. Sherryll Burger ophthalmology unknown to outcome.  No answer today and unable to leave VM as mailbox is full.  Next appointment with [facility] is scheduled for 08/29/23  Francee Nodal RN [facility] Gunn/[facility]

## 2023-09-02 ENCOUNTER — Telehealth: Payer: Self-pay

## 2023-09-02 NOTE — Telephone Encounter (Signed)
Attempted call for follow up of [facility] client who had been recently seen in ED at [facility] on 08/22/23. No answer today and unable to Leave VM as it is full.  Appointment to [facility] was rescheduled from 08/29/23 to 09/03/23  This is 2nd attempt to contact.  Francee Nodal RN [facility] [employer]

## 2023-11-28 ENCOUNTER — Telehealth: Payer: Self-pay

## 2023-11-28 NOTE — Telephone Encounter (Signed)
 Attempted follow up call to care connect [facility] client. No answer and unable to leave message as voicemail is full.  Next appointment at [facility] is scheduled for 02/10/24  Last A1C 6.2 on 08/13/23 controlled based on [facility], previous A1C was 5.8 on 02/14/23  Will continue to monitor and follow for any possible resources needed, Diabetic support and education.   Kris Pester RN [facility] [employer]

## 2024-01-20 ENCOUNTER — Telehealth: Payer: Self-pay | Admitting: Physician Assistant

## 2024-01-20 DIAGNOSIS — K047 Periapical abscess without sinus: Secondary | ICD-10-CM

## 2024-01-20 MED ORDER — AMOXICILLIN-POT CLAVULANATE 875-125 MG PO TABS: 1.0000 | ORAL_TABLET | Freq: Two times a day (BID) | ORAL | 0 refills | Status: AC

## 2024-01-20 NOTE — Progress Notes (Signed)

## 2024-06-03 ENCOUNTER — Telehealth: Payer: Self-pay

## 2024-06-03 NOTE — Telephone Encounter (Signed)
 Attempted call to [facility] and [facility] client for follow up for any potential needs or resources. NO answer.  Next appointment scheduled for 08/27/24   Avelina JONELLE Skeen RN [facility]
